# Patient Record
Sex: Male | Born: 1965 | Race: White | Hispanic: No | Marital: Married | Smoking: Never smoker
Health system: Southern US, Community
[De-identification: ages and names within clinical notes are randomized; demographics above are authoritative.]

## PROBLEM LIST (undated history)

## (undated) DIAGNOSIS — E119 Type 2 diabetes mellitus without complications: Secondary | ICD-10-CM

## (undated) DIAGNOSIS — I10 Essential (primary) hypertension: Secondary | ICD-10-CM

## (undated) DIAGNOSIS — E785 Hyperlipidemia, unspecified: Secondary | ICD-10-CM

## (undated) HISTORY — DX: Essential (primary) hypertension: I10

## (undated) HISTORY — DX: Hyperlipidemia, unspecified: E78.5

## (undated) HISTORY — PX: CATARACT EXTRACTION: SUR2

## (undated) HISTORY — DX: Type 2 diabetes mellitus without complications: E11.9

---
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
## 2005-01-26 ENCOUNTER — Ambulatory Visit (HOSPITAL_COMMUNITY): Admission: RE | Admit: 2005-01-26 | Discharge: 2005-01-26 | Payer: Self-pay | Admitting: Internal Medicine

## 2005-01-26 IMAGING — US US RENAL
1 series · 14 of 25 positions shown · non-contrast
Comparison: None.

CLINICAL DATA: Decreased renal function; protein in the urine.
 RENAL/URINARY TRACT ULTRASOUND:
TECHNIQUE: Complete ultrasound examination of the urinary tract was performed including evaluation of the kidneys, renal collecting systems, and urinary bladder.

[Series 1: unknown · 0.28mm/px · 14 of 29 slices shown]
[im 1/29]
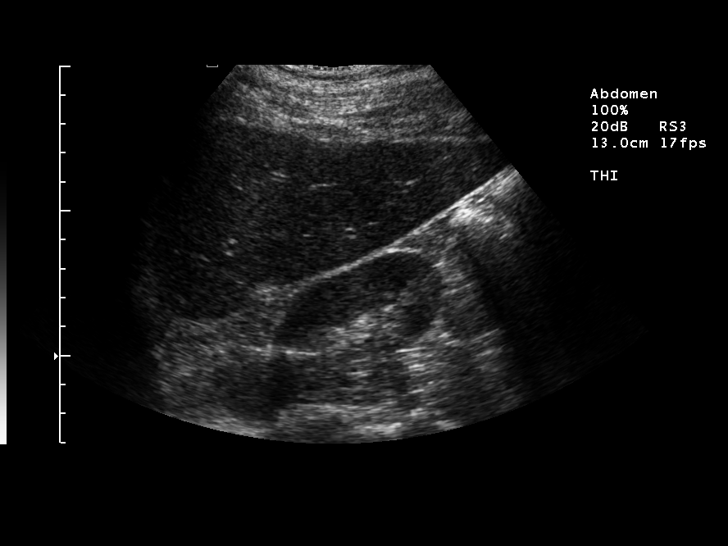
[im 3/29]
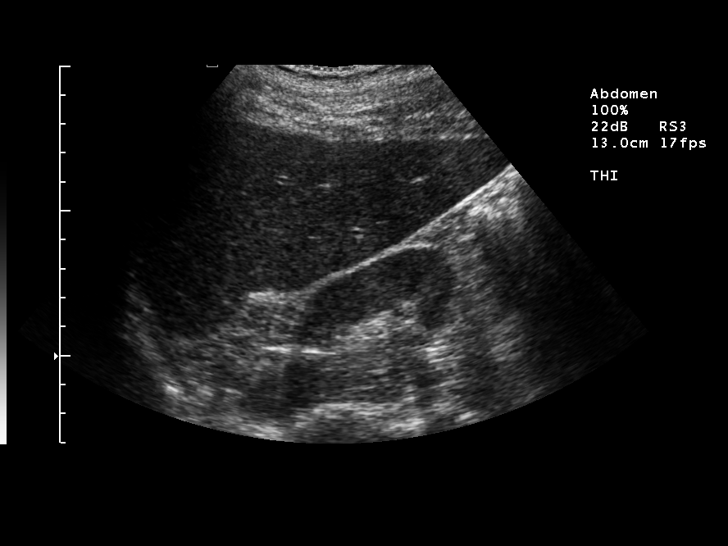
[im 5/29]
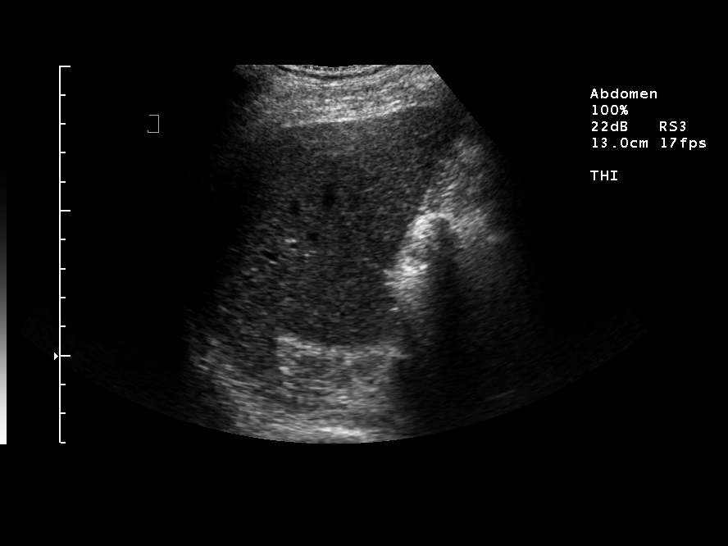
[im 8/29]
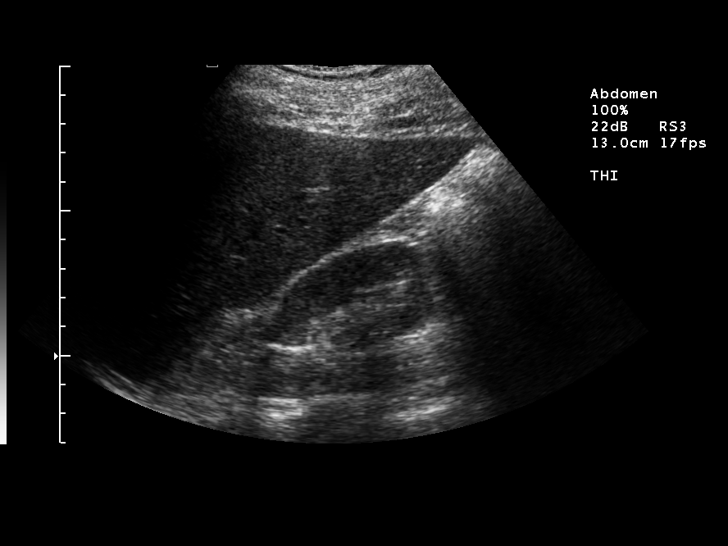
[im 10/29]
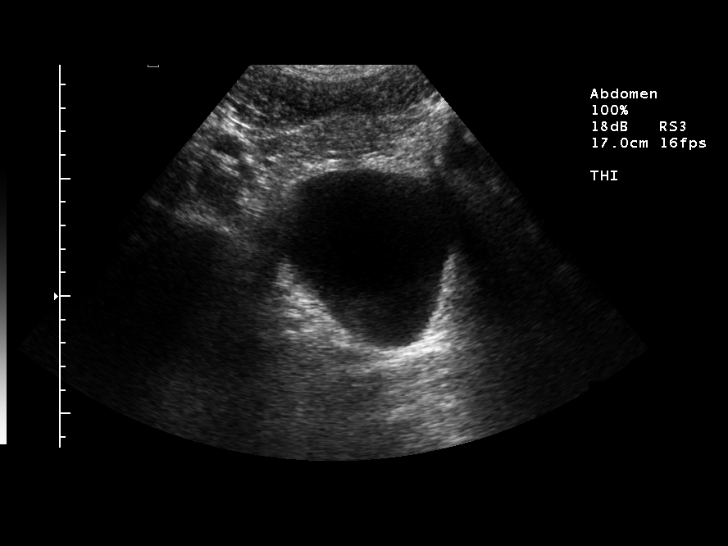
[im 11/29]
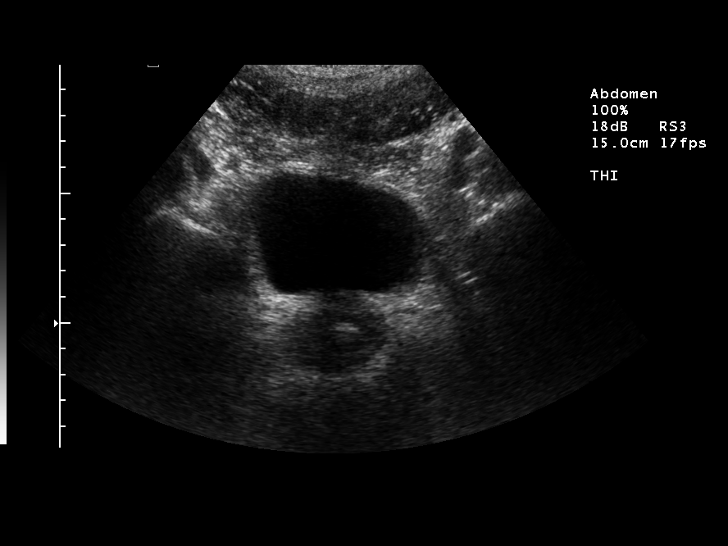
[im 13/29]
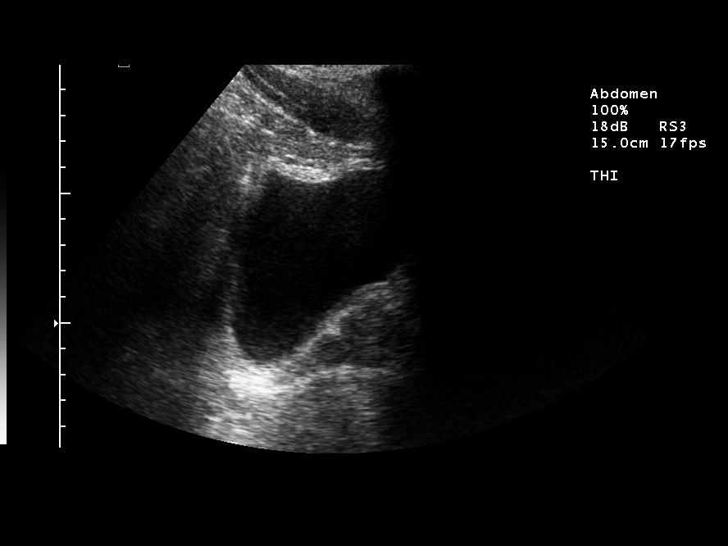
[im 16/29]
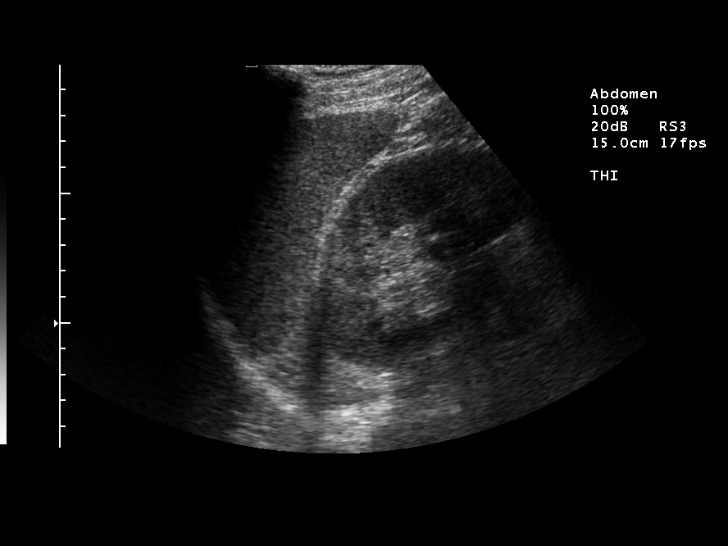
[im 18/29]
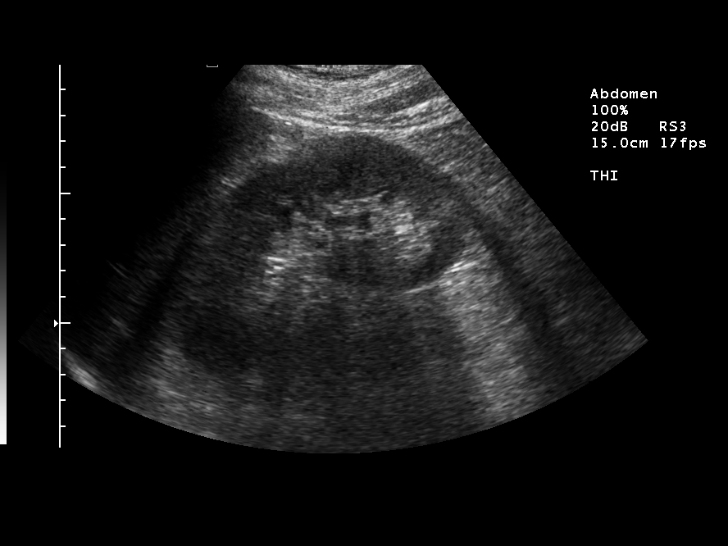
[im 19/29]
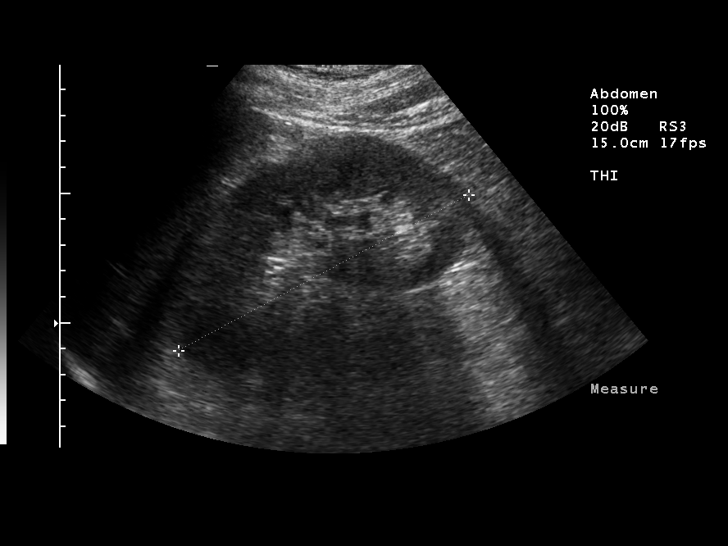
[im 22/29]
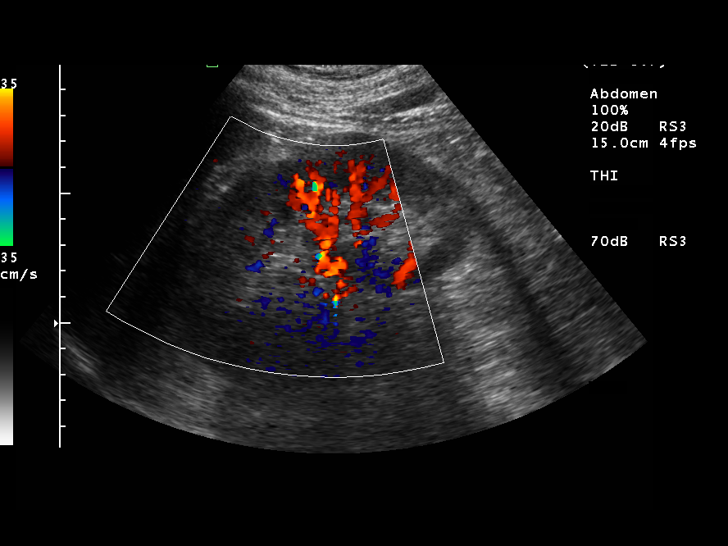
[im 24/29]
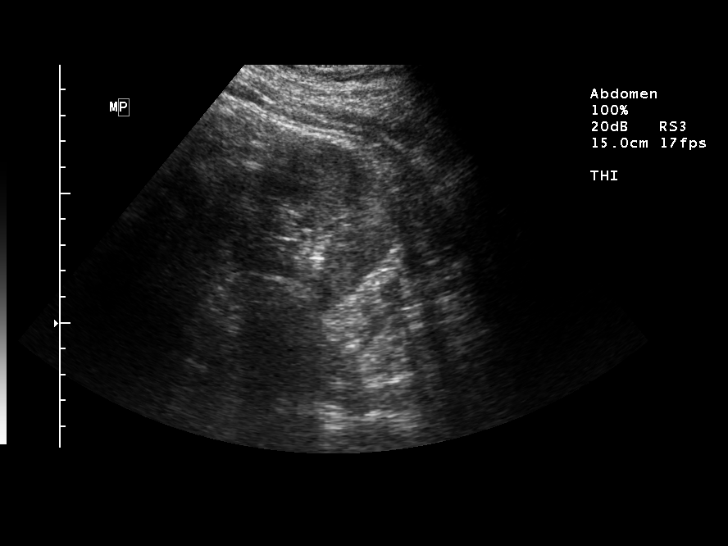
[im 26/29]
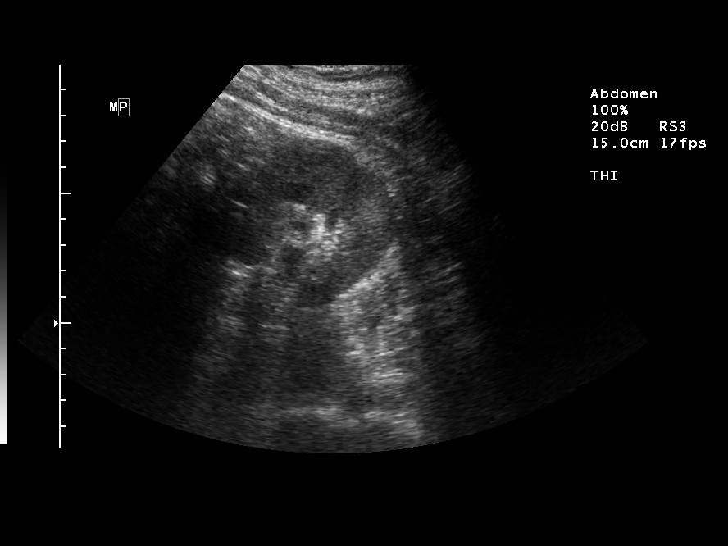
[im 29/29]
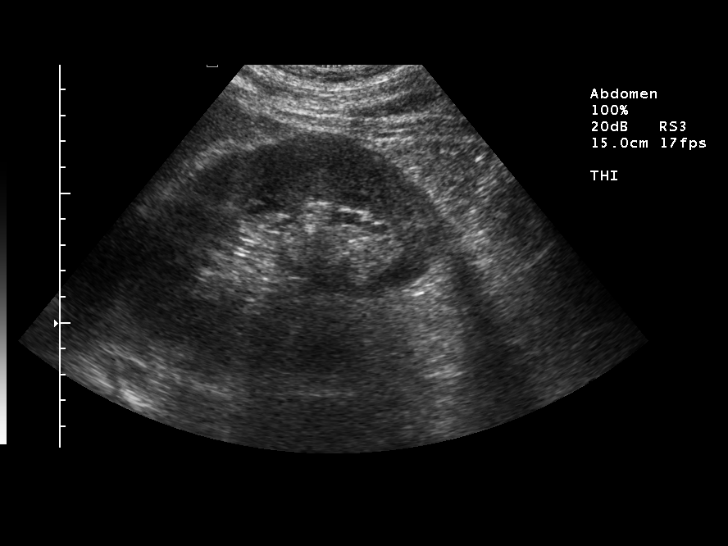

[14 of 25 positions shown; findings below may reference images not displayed]

FINDINGS: The right kidney measures 6.2 and the left kidney measures 12.7 cm.  The patient reportedly has a history of damaged upper pole of the right kidney from previous accident.  Remaining cortex is unremarkable, however.  
 Bladder is unremarkable with apparent prominence of the prostate gland.
IMPRESSION: 1.  Right kidney is small but the visible cortex of the right kidney is unremarkable.  
 2.  No abnormality on the left.
 3.  No hydronephrosis.

## 2015-05-19 DIAGNOSIS — R232 Flushing: Secondary | ICD-10-CM | POA: Diagnosis not present

## 2015-05-19 DIAGNOSIS — I10 Essential (primary) hypertension: Secondary | ICD-10-CM | POA: Diagnosis not present

## 2015-05-19 DIAGNOSIS — E109 Type 1 diabetes mellitus without complications: Secondary | ICD-10-CM | POA: Diagnosis not present

## 2015-05-19 DIAGNOSIS — R209 Unspecified disturbances of skin sensation: Secondary | ICD-10-CM | POA: Diagnosis not present

## 2015-06-24 DIAGNOSIS — E109 Type 1 diabetes mellitus without complications: Secondary | ICD-10-CM | POA: Diagnosis not present

## 2015-09-19 DIAGNOSIS — E109 Type 1 diabetes mellitus without complications: Secondary | ICD-10-CM | POA: Diagnosis not present

## 2015-09-23 DIAGNOSIS — I10 Essential (primary) hypertension: Secondary | ICD-10-CM | POA: Diagnosis not present

## 2015-09-23 DIAGNOSIS — Z125 Encounter for screening for malignant neoplasm of prostate: Secondary | ICD-10-CM | POA: Diagnosis not present

## 2015-09-23 DIAGNOSIS — E109 Type 1 diabetes mellitus without complications: Secondary | ICD-10-CM | POA: Diagnosis not present

## 2015-09-23 DIAGNOSIS — E784 Other hyperlipidemia: Secondary | ICD-10-CM | POA: Diagnosis not present

## 2015-09-29 DIAGNOSIS — I10 Essential (primary) hypertension: Secondary | ICD-10-CM | POA: Diagnosis not present

## 2015-09-29 DIAGNOSIS — R209 Unspecified disturbances of skin sensation: Secondary | ICD-10-CM | POA: Diagnosis not present

## 2015-09-29 DIAGNOSIS — Z0000 Encounter for general adult medical examination without abnormal findings: Secondary | ICD-10-CM | POA: Diagnosis not present

## 2015-09-29 DIAGNOSIS — E109 Type 1 diabetes mellitus without complications: Secondary | ICD-10-CM | POA: Diagnosis not present

## 2015-09-29 DIAGNOSIS — Z23 Encounter for immunization: Secondary | ICD-10-CM | POA: Diagnosis not present

## 2015-10-21 DIAGNOSIS — E109 Type 1 diabetes mellitus without complications: Secondary | ICD-10-CM | POA: Diagnosis not present

## 2015-11-09 DIAGNOSIS — J069 Acute upper respiratory infection, unspecified: Secondary | ICD-10-CM | POA: Diagnosis not present

## 2015-11-17 DIAGNOSIS — M7671 Peroneal tendinitis, right leg: Secondary | ICD-10-CM | POA: Diagnosis not present

## 2015-11-17 DIAGNOSIS — R200 Anesthesia of skin: Secondary | ICD-10-CM | POA: Diagnosis not present

## 2016-01-07 DIAGNOSIS — E119 Type 2 diabetes mellitus without complications: Secondary | ICD-10-CM | POA: Diagnosis not present

## 2016-01-19 DIAGNOSIS — E109 Type 1 diabetes mellitus without complications: Secondary | ICD-10-CM | POA: Diagnosis not present

## 2016-01-19 DIAGNOSIS — Z6827 Body mass index (BMI) 27.0-27.9, adult: Secondary | ICD-10-CM | POA: Diagnosis not present

## 2016-01-19 DIAGNOSIS — R209 Unspecified disturbances of skin sensation: Secondary | ICD-10-CM | POA: Diagnosis not present

## 2016-01-19 DIAGNOSIS — I10 Essential (primary) hypertension: Secondary | ICD-10-CM | POA: Diagnosis not present

## 2016-04-12 DIAGNOSIS — M25521 Pain in right elbow: Secondary | ICD-10-CM | POA: Diagnosis not present

## 2016-04-12 DIAGNOSIS — M25511 Pain in right shoulder: Secondary | ICD-10-CM | POA: Diagnosis not present

## 2016-04-14 DIAGNOSIS — R531 Weakness: Secondary | ICD-10-CM | POA: Diagnosis not present

## 2016-04-14 DIAGNOSIS — M25521 Pain in right elbow: Secondary | ICD-10-CM | POA: Diagnosis not present

## 2016-04-14 DIAGNOSIS — M7711 Lateral epicondylitis, right elbow: Secondary | ICD-10-CM | POA: Diagnosis not present

## 2016-06-15 DIAGNOSIS — E109 Type 1 diabetes mellitus without complications: Secondary | ICD-10-CM | POA: Diagnosis not present

## 2016-06-28 DIAGNOSIS — Z6827 Body mass index (BMI) 27.0-27.9, adult: Secondary | ICD-10-CM | POA: Diagnosis not present

## 2016-06-28 DIAGNOSIS — I10 Essential (primary) hypertension: Secondary | ICD-10-CM | POA: Diagnosis not present

## 2016-06-28 DIAGNOSIS — Z1389 Encounter for screening for other disorder: Secondary | ICD-10-CM | POA: Diagnosis not present

## 2016-06-28 DIAGNOSIS — E109 Type 1 diabetes mellitus without complications: Secondary | ICD-10-CM | POA: Diagnosis not present

## 2016-06-28 DIAGNOSIS — E785 Hyperlipidemia, unspecified: Secondary | ICD-10-CM | POA: Diagnosis not present

## 2016-08-24 DIAGNOSIS — K3580 Unspecified acute appendicitis: Secondary | ICD-10-CM | POA: Diagnosis not present

## 2016-10-18 DIAGNOSIS — I10 Essential (primary) hypertension: Secondary | ICD-10-CM | POA: Diagnosis not present

## 2016-10-18 DIAGNOSIS — E109 Type 1 diabetes mellitus without complications: Secondary | ICD-10-CM | POA: Diagnosis not present

## 2016-10-18 DIAGNOSIS — Z125 Encounter for screening for malignant neoplasm of prostate: Secondary | ICD-10-CM | POA: Diagnosis not present

## 2016-10-18 DIAGNOSIS — Z0000 Encounter for general adult medical examination without abnormal findings: Secondary | ICD-10-CM | POA: Diagnosis not present

## 2016-10-25 DIAGNOSIS — N528 Other male erectile dysfunction: Secondary | ICD-10-CM | POA: Diagnosis not present

## 2016-10-25 DIAGNOSIS — E109 Type 1 diabetes mellitus without complications: Secondary | ICD-10-CM | POA: Diagnosis not present

## 2016-10-25 DIAGNOSIS — Z23 Encounter for immunization: Secondary | ICD-10-CM | POA: Diagnosis not present

## 2016-10-25 DIAGNOSIS — Z1389 Encounter for screening for other disorder: Secondary | ICD-10-CM | POA: Diagnosis not present

## 2016-10-25 DIAGNOSIS — E784 Other hyperlipidemia: Secondary | ICD-10-CM | POA: Diagnosis not present

## 2016-10-25 DIAGNOSIS — Z0000 Encounter for general adult medical examination without abnormal findings: Secondary | ICD-10-CM | POA: Diagnosis not present

## 2016-10-25 DIAGNOSIS — I10 Essential (primary) hypertension: Secondary | ICD-10-CM | POA: Diagnosis not present

## 2016-10-27 DIAGNOSIS — E109 Type 1 diabetes mellitus without complications: Secondary | ICD-10-CM | POA: Diagnosis not present

## 2016-10-27 DIAGNOSIS — Z794 Long term (current) use of insulin: Secondary | ICD-10-CM | POA: Diagnosis not present

## 2016-11-12 DIAGNOSIS — Z794 Long term (current) use of insulin: Secondary | ICD-10-CM | POA: Diagnosis not present

## 2016-11-12 DIAGNOSIS — E109 Type 1 diabetes mellitus without complications: Secondary | ICD-10-CM | POA: Diagnosis not present

## 2016-12-01 ENCOUNTER — Ambulatory Visit (INDEPENDENT_AMBULATORY_CARE_PROVIDER_SITE_OTHER): Payer: Self-pay | Admitting: Emergency Medicine

## 2016-12-01 VITALS — BP 122/84 | HR 66 | Temp 98.4°F | Resp 17 | Wt 194.6 lb

## 2016-12-01 DIAGNOSIS — J069 Acute upper respiratory infection, unspecified: Secondary | ICD-10-CM

## 2016-12-01 MED ORDER — AZITHROMYCIN 250 MG PO TABS: ORAL_TABLET | ORAL | 0 refills | Status: DC

## 2016-12-01 MED ORDER — GUAIFENESIN-CODEINE 100-10 MG/5ML PO SOLN: 5.0000 mL | Freq: Four times a day (QID) | ORAL | 0 refills | Status: DC | PRN

## 2016-12-01 NOTE — Progress Notes (Signed)
Subjective:  Steven Hoffman is a 51 y.o. male who presents for evaluation of URI like symptoms.  Symptoms include achiness, congestion, coryza, cough described as nonproductive, chills and non productive cough.  Onset of symptoms was 1 week ago, and has been unchanged since that time.  Treatment to date:  cough suppressants, decongestants and rest.  High risk factors for influenza complications:  none.  The following portions of the patient's history were reviewed and updated as appropriate:  allergies, current medications and past medical history.  Pertinent items are noted in HPI. Objective:   Vitals:   12/01/16 1436  BP: 122/84  Pulse: 66  Resp: 17  Temp: 98.4 F (36.9 C)  SpO2: 96%   Physical Exam  Constitutional: He is oriented to person, place, and time. He appears well-developed and well-nourished. No distress.  HENT:  Head: Normocephalic.  Right Ear: Tympanic membrane and external ear normal.  Left Ear: Tympanic membrane and external ear normal.  Mouth/Throat: Oropharynx is clear and moist.  Eyes: Conjunctivae are normal.  Neck: Normal range of motion.  Cardiovascular: Normal rate and regular rhythm.   Pulmonary/Chest: Effort normal and breath sounds normal. He has no wheezes.  Abdominal: Soft. Bowel sounds are normal.  Lymphadenopathy:    He has no cervical adenopathy.  Neurological: He is alert and oriented to person, place, and time.  Skin: Skin is warm and dry. Capillary refill takes less than 2 seconds. He is not diaphoretic.  Nursing note and vitals reviewed.     Assessment:  viral upper respiratory illness    Plan:  Discussed diagnosis and treatment of URI. Discussed the importance of avoiding unnecessary antibiotic therapy. Educational material distributed and questions answered. Suggested symptomatic OTC remedies. Suggested brief course of Afrin or similar. Supportive care with appropriate antipyretics and fluids. Follow up as needed. Provided a delayed  fill Rx for Azithromycin but encouraged not to fill for 3 days.   Provided counseling about sedation and need to avoid driving while on Cheratussin due to narcotic effects.

## 2016-12-01 NOTE — Patient Instructions (Signed)
Viral Illness, Adult Viruses are tiny germs that can get into a person's body and cause illness. There are many different types of viruses, and they cause many types of illness. Viral illnesses can range from mild to severe. They can affect various parts of the body. Common illnesses that are caused by a virus include colds and the flu. Viral illnesses also include serious conditions such as HIV/AIDS (human immunodeficiency virus/acquired immunodeficiency syndrome). A few viruses have been linked to certain cancers. What are the causes? Many types of viruses can cause illness. Viruses invade cells in your body, multiply, and cause the infected cells to malfunction or die. When the cell dies, it releases more of the virus. When this happens, you develop symptoms of the illness, and the virus continues to spread to other cells. If the virus takes over the function of the cell, it can cause the cell to divide and grow out of control, as is the case when a virus causes cancer. Different viruses get into the body in different ways. You can get a virus by:  Swallowing food or water that is contaminated with the virus.  Breathing in droplets that have been coughed or sneezed into the air by an infected person.  Touching a surface that has been contaminated with the virus and then touching your eyes, nose, or mouth.  Being bitten by an insect or animal that carries the virus.  Having sexual contact with a person who is infected with the virus.  Being exposed to blood or fluids that contain the virus, either through an open cut or during a transfusion.  If a virus enters your body, your body's defense system (immune system) will try to fight the virus. You may be at higher risk for a viral illness if your immune system is weak. What are the signs or symptoms? Symptoms vary depending on the type of virus and the location of the cells that it invades. Common symptoms of the main types of viral illnesses  include: Cold and flu viruses  Fever.  Headache.  Sore throat.  Muscle aches.  Nasal congestion.  Cough. Digestive system (gastrointestinal) viruses  Fever.  Abdominal pain.  Nausea.  Diarrhea. Liver viruses (hepatitis)  Loss of appetite.  Tiredness.  Yellowing of the skin (jaundice). Brain and spinal cord viruses  Fever.  Headache.  Stiff neck.  Nausea and vomiting.  Confusion or sleepiness. Skin viruses  Warts.  Itching.  Rash. Sexually transmitted viruses  Discharge.  Swelling.  Redness.  Rash. How is this treated? Viruses can be difficult to treat because they live within cells. Antibiotic medicines do not treat viruses because these drugs do not get inside cells. Treatment for a viral illness may include:  Resting and drinking plenty of fluids.  Medicines to relieve symptoms. These can include over-the-counter medicine for pain and fever, medicines for cough or congestion, and medicines to relieve diarrhea.  Antiviral medicines. These drugs are available only for certain types of viruses. They may help reduce flu symptoms if taken early. There are also many antiviral medicines for hepatitis and HIV/AIDS.  Some viral illnesses can be prevented with vaccinations. A common example is the flu shot. Follow these instructions at home: Medicines   Take over-the-counter and prescription medicines only as told by your health care provider.  If you were prescribed an antiviral medicine, take it as told by your health care provider. Do not stop taking the medicine even if you start to feel better.  Be aware   of when antibiotics are needed and when they are not needed. Antibiotics do not treat viruses. If your health care provider thinks that you may have a bacterial infection as well as a viral infection, you may get an antibiotic. ? Do not ask for an antibiotic prescription if you have been diagnosed with a viral illness. That will not make your  illness go away faster. ? Frequently taking antibiotics when they are not needed can lead to antibiotic resistance. When this develops, the medicine no longer works against the bacteria that it normally fights. General instructions  Drink enough fluids to keep your urine clear or pale yellow.  Rest as much as possible.  Return to your normal activities as told by your health care provider. Ask your health care provider what activities are safe for you.  Keep all follow-up visits as told by your health care provider. This is important. How is this prevented? Take these actions to reduce your risk of viral infection:  Eat a healthy diet and get enough rest.  Wash your hands often with soap and water. This is especially important when you are in public places. If soap and water are not available, use hand sanitizer.  Avoid close contact with friends and family who have a viral illness.  If you travel to areas where viral gastrointestinal infection is common, avoid drinking water or eating raw food.  Keep your immunizations up to date. Get a flu shot every year as told by your health care provider.  Do not share toothbrushes, nail clippers, razors, or needles with other people.  Always practice safe sex.  Contact a health care provider if:  You have symptoms of a viral illness that do not go away.  Your symptoms come back after going away.  Your symptoms get worse. Get help right away if:  You have trouble breathing.  You have a severe headache or a stiff neck.  You have severe vomiting or abdominal pain. This information is not intended to replace advice given to you by your health care provider. Make sure you discuss any questions you have with your health care provider. Document Released: 06/06/2015 Document Revised: 07/09/2015 Document Reviewed: 06/06/2015 Elsevier Interactive Patient Education  2018 [employer]  

## 2016-12-03 ENCOUNTER — Telehealth: Payer: Self-pay

## 2016-12-23 DIAGNOSIS — E109 Type 1 diabetes mellitus without complications: Secondary | ICD-10-CM | POA: Diagnosis not present

## 2017-02-05 NOTE — Telephone Encounter (Signed)
I called to his home number and work number and I wasnt able to contact Mr. Job Holtsclaw

## 2017-02-17 DIAGNOSIS — Z794 Long term (current) use of insulin: Secondary | ICD-10-CM | POA: Diagnosis not present

## 2017-02-17 DIAGNOSIS — E109 Type 1 diabetes mellitus without complications: Secondary | ICD-10-CM | POA: Diagnosis not present

## 2017-04-18 DIAGNOSIS — E109 Type 1 diabetes mellitus without complications: Secondary | ICD-10-CM | POA: Diagnosis not present

## 2017-04-18 DIAGNOSIS — Z6827 Body mass index (BMI) 27.0-27.9, adult: Secondary | ICD-10-CM | POA: Diagnosis not present

## 2017-04-18 DIAGNOSIS — E785 Hyperlipidemia, unspecified: Secondary | ICD-10-CM | POA: Diagnosis not present

## 2017-04-18 DIAGNOSIS — I10 Essential (primary) hypertension: Secondary | ICD-10-CM | POA: Diagnosis not present

## 2017-05-18 DIAGNOSIS — E109 Type 1 diabetes mellitus without complications: Secondary | ICD-10-CM | POA: Diagnosis not present

## 2017-05-18 DIAGNOSIS — Z794 Long term (current) use of insulin: Secondary | ICD-10-CM | POA: Diagnosis not present

## 2017-08-17 DIAGNOSIS — Z794 Long term (current) use of insulin: Secondary | ICD-10-CM | POA: Diagnosis not present

## 2017-08-17 DIAGNOSIS — E109 Type 1 diabetes mellitus without complications: Secondary | ICD-10-CM | POA: Diagnosis not present

## 2017-09-15 DIAGNOSIS — I10 Essential (primary) hypertension: Secondary | ICD-10-CM | POA: Diagnosis not present

## 2017-09-15 DIAGNOSIS — M62838 Other muscle spasm: Secondary | ICD-10-CM | POA: Diagnosis not present

## 2017-09-15 DIAGNOSIS — E860 Dehydration: Secondary | ICD-10-CM | POA: Diagnosis not present

## 2017-09-19 DIAGNOSIS — M62838 Other muscle spasm: Secondary | ICD-10-CM | POA: Diagnosis not present

## 2017-09-20 DIAGNOSIS — M6282 Rhabdomyolysis: Secondary | ICD-10-CM | POA: Diagnosis not present

## 2017-09-20 DIAGNOSIS — M62838 Other muscle spasm: Secondary | ICD-10-CM | POA: Diagnosis not present

## 2017-09-20 DIAGNOSIS — I10 Essential (primary) hypertension: Secondary | ICD-10-CM | POA: Diagnosis not present

## 2017-09-20 DIAGNOSIS — E109 Type 1 diabetes mellitus without complications: Secondary | ICD-10-CM | POA: Diagnosis not present

## 2017-11-18 DIAGNOSIS — Z794 Long term (current) use of insulin: Secondary | ICD-10-CM | POA: Diagnosis not present

## 2017-11-18 DIAGNOSIS — E109 Type 1 diabetes mellitus without complications: Secondary | ICD-10-CM | POA: Diagnosis not present

## 2017-11-21 DIAGNOSIS — Z794 Long term (current) use of insulin: Secondary | ICD-10-CM | POA: Diagnosis not present

## 2017-11-21 DIAGNOSIS — E860 Dehydration: Secondary | ICD-10-CM | POA: Diagnosis not present

## 2017-11-21 DIAGNOSIS — E109 Type 1 diabetes mellitus without complications: Secondary | ICD-10-CM | POA: Diagnosis not present

## 2017-11-24 DIAGNOSIS — M6282 Rhabdomyolysis: Secondary | ICD-10-CM | POA: Diagnosis not present

## 2017-11-24 DIAGNOSIS — Z1389 Encounter for screening for other disorder: Secondary | ICD-10-CM | POA: Diagnosis not present

## 2017-11-24 DIAGNOSIS — I10 Essential (primary) hypertension: Secondary | ICD-10-CM | POA: Diagnosis not present

## 2017-11-24 DIAGNOSIS — E860 Dehydration: Secondary | ICD-10-CM | POA: Diagnosis not present

## 2017-11-24 DIAGNOSIS — M722 Plantar fascial fibromatosis: Secondary | ICD-10-CM | POA: Diagnosis not present

## 2018-01-02 DIAGNOSIS — M25571 Pain in right ankle and joints of right foot: Secondary | ICD-10-CM | POA: Diagnosis not present

## 2018-01-02 DIAGNOSIS — M25561 Pain in right knee: Secondary | ICD-10-CM | POA: Diagnosis not present

## 2018-01-09 ENCOUNTER — Encounter: Payer: Self-pay | Admitting: Sports Medicine

## 2018-02-15 DIAGNOSIS — E109 Type 1 diabetes mellitus without complications: Secondary | ICD-10-CM | POA: Diagnosis not present

## 2018-02-20 DIAGNOSIS — Z794 Long term (current) use of insulin: Secondary | ICD-10-CM | POA: Diagnosis not present

## 2018-02-20 DIAGNOSIS — E109 Type 1 diabetes mellitus without complications: Secondary | ICD-10-CM | POA: Diagnosis not present

## 2018-02-22 DIAGNOSIS — E109 Type 1 diabetes mellitus without complications: Secondary | ICD-10-CM | POA: Diagnosis not present

## 2018-02-22 DIAGNOSIS — R82998 Other abnormal findings in urine: Secondary | ICD-10-CM | POA: Diagnosis not present

## 2018-02-22 DIAGNOSIS — I10 Essential (primary) hypertension: Secondary | ICD-10-CM | POA: Diagnosis not present

## 2018-02-28 DIAGNOSIS — E109 Type 1 diabetes mellitus without complications: Secondary | ICD-10-CM | POA: Diagnosis not present

## 2018-02-28 DIAGNOSIS — E7849 Other hyperlipidemia: Secondary | ICD-10-CM | POA: Diagnosis not present

## 2018-02-28 DIAGNOSIS — Z0000 Encounter for general adult medical examination without abnormal findings: Secondary | ICD-10-CM | POA: Diagnosis not present

## 2018-02-28 DIAGNOSIS — Z125 Encounter for screening for malignant neoplasm of prostate: Secondary | ICD-10-CM | POA: Diagnosis not present

## 2018-02-28 DIAGNOSIS — N529 Male erectile dysfunction, unspecified: Secondary | ICD-10-CM | POA: Diagnosis not present

## 2018-02-28 DIAGNOSIS — Z1389 Encounter for screening for other disorder: Secondary | ICD-10-CM | POA: Diagnosis not present

## 2018-02-28 DIAGNOSIS — I10 Essential (primary) hypertension: Secondary | ICD-10-CM | POA: Diagnosis not present

## 2018-06-29 DIAGNOSIS — R221 Localized swelling, mass and lump, neck: Secondary | ICD-10-CM | POA: Diagnosis not present

## 2018-06-29 DIAGNOSIS — Z03818 Encounter for observation for suspected exposure to other biological agents ruled out: Secondary | ICD-10-CM | POA: Diagnosis not present

## 2018-07-07 ENCOUNTER — Other Ambulatory Visit: Payer: Self-pay | Admitting: Internal Medicine

## 2018-07-07 DIAGNOSIS — R221 Localized swelling, mass and lump, neck: Secondary | ICD-10-CM

## 2018-07-11 ENCOUNTER — Ambulatory Visit
Admission: RE | Admit: 2018-07-11 | Discharge: 2018-07-11 | Disposition: A | Discharge status: Home / Self Care | Payer: BLUE CROSS/BLUE SHIELD | Source: Ambulatory Visit | Attending: Internal Medicine | Admitting: Internal Medicine

## 2018-07-11 DIAGNOSIS — R221 Localized swelling, mass and lump, neck: Secondary | ICD-10-CM

## 2018-07-11 MED ORDER — IOPAMIDOL (ISOVUE-300) INJECTION 61%
75.0000 mL | Freq: Once | INTRAVENOUS | Status: AC | PRN
  Administered 2018-07-11: 75 mL via INTRAVENOUS

## 2018-10-12 DIAGNOSIS — R5383 Other fatigue: Secondary | ICD-10-CM | POA: Diagnosis not present

## 2018-10-12 DIAGNOSIS — R891 Abnormal level of hormones in specimens from other organs, systems and tissues: Secondary | ICD-10-CM | POA: Diagnosis not present

## 2018-11-01 ENCOUNTER — Other Ambulatory Visit: Payer: Self-pay

## 2018-11-01 DIAGNOSIS — Z20822 Contact with and (suspected) exposure to covid-19: Secondary | ICD-10-CM

## 2018-11-03 LAB — NOVEL CORONAVIRUS, NAA: SARS-CoV-2, NAA: NOT DETECTED

## 2018-11-27 DIAGNOSIS — I10 Essential (primary) hypertension: Secondary | ICD-10-CM | POA: Diagnosis not present

## 2018-11-27 DIAGNOSIS — E785 Hyperlipidemia, unspecified: Secondary | ICD-10-CM | POA: Diagnosis not present

## 2018-11-27 DIAGNOSIS — M545 Low back pain: Secondary | ICD-10-CM | POA: Diagnosis not present

## 2018-11-27 DIAGNOSIS — E109 Type 1 diabetes mellitus without complications: Secondary | ICD-10-CM | POA: Diagnosis not present

## 2018-12-18 DIAGNOSIS — I10 Essential (primary) hypertension: Secondary | ICD-10-CM | POA: Diagnosis not present

## 2018-12-21 DIAGNOSIS — Z86010 Personal history of colonic polyps: Secondary | ICD-10-CM | POA: Diagnosis not present

## 2018-12-21 DIAGNOSIS — Z1211 Encounter for screening for malignant neoplasm of colon: Secondary | ICD-10-CM | POA: Diagnosis not present

## 2018-12-21 DIAGNOSIS — K5730 Diverticulosis of large intestine without perforation or abscess without bleeding: Secondary | ICD-10-CM | POA: Diagnosis not present

## 2018-12-29 DIAGNOSIS — D123 Benign neoplasm of transverse colon: Secondary | ICD-10-CM | POA: Diagnosis not present

## 2018-12-29 DIAGNOSIS — K635 Polyp of colon: Secondary | ICD-10-CM | POA: Diagnosis not present

## 2018-12-29 DIAGNOSIS — K5730 Diverticulosis of large intestine without perforation or abscess without bleeding: Secondary | ICD-10-CM | POA: Diagnosis not present

## 2018-12-29 DIAGNOSIS — D122 Benign neoplasm of ascending colon: Secondary | ICD-10-CM | POA: Diagnosis not present

## 2018-12-29 DIAGNOSIS — D120 Benign neoplasm of cecum: Secondary | ICD-10-CM | POA: Diagnosis not present

## 2018-12-29 DIAGNOSIS — K6389 Other specified diseases of intestine: Secondary | ICD-10-CM | POA: Diagnosis not present

## 2018-12-29 DIAGNOSIS — Z1211 Encounter for screening for malignant neoplasm of colon: Secondary | ICD-10-CM | POA: Diagnosis not present

## 2019-02-20 DIAGNOSIS — Z03818 Encounter for observation for suspected exposure to other biological agents ruled out: Secondary | ICD-10-CM | POA: Diagnosis not present

## 2019-02-26 DIAGNOSIS — Z125 Encounter for screening for malignant neoplasm of prostate: Secondary | ICD-10-CM | POA: Diagnosis not present

## 2019-02-26 DIAGNOSIS — E7849 Other hyperlipidemia: Secondary | ICD-10-CM | POA: Diagnosis not present

## 2019-02-26 DIAGNOSIS — Z0000 Encounter for general adult medical examination without abnormal findings: Secondary | ICD-10-CM | POA: Diagnosis not present

## 2019-02-26 DIAGNOSIS — E109 Type 1 diabetes mellitus without complications: Secondary | ICD-10-CM | POA: Diagnosis not present

## 2019-03-01 DIAGNOSIS — I10 Essential (primary) hypertension: Secondary | ICD-10-CM | POA: Diagnosis not present

## 2019-03-01 DIAGNOSIS — R82998 Other abnormal findings in urine: Secondary | ICD-10-CM | POA: Diagnosis not present

## 2019-03-05 DIAGNOSIS — E785 Hyperlipidemia, unspecified: Secondary | ICD-10-CM | POA: Diagnosis not present

## 2019-03-05 DIAGNOSIS — Z1331 Encounter for screening for depression: Secondary | ICD-10-CM | POA: Diagnosis not present

## 2019-03-05 DIAGNOSIS — E109 Type 1 diabetes mellitus without complications: Secondary | ICD-10-CM | POA: Diagnosis not present

## 2019-03-05 DIAGNOSIS — N529 Male erectile dysfunction, unspecified: Secondary | ICD-10-CM | POA: Diagnosis not present

## 2019-03-05 DIAGNOSIS — I10 Essential (primary) hypertension: Secondary | ICD-10-CM | POA: Diagnosis not present

## 2019-03-05 DIAGNOSIS — Z0000 Encounter for general adult medical examination without abnormal findings: Secondary | ICD-10-CM | POA: Diagnosis not present

## 2019-03-12 ENCOUNTER — Ambulatory Visit: Payer: BC Managed Care – PPO | Attending: Internal Medicine

## 2019-03-12 DIAGNOSIS — Z20822 Contact with and (suspected) exposure to covid-19: Secondary | ICD-10-CM | POA: Diagnosis not present

## 2019-03-13 LAB — NOVEL CORONAVIRUS, NAA: SARS-CoV-2, NAA: NOT DETECTED

## 2019-03-22 ENCOUNTER — Ambulatory Visit: Payer: BC Managed Care – PPO | Attending: Internal Medicine

## 2019-03-22 DIAGNOSIS — Z20822 Contact with and (suspected) exposure to covid-19: Secondary | ICD-10-CM | POA: Diagnosis not present

## 2019-03-23 LAB — NOVEL CORONAVIRUS, NAA: SARS-CoV-2, NAA: NOT DETECTED

## 2019-06-26 DIAGNOSIS — E109 Type 1 diabetes mellitus without complications: Secondary | ICD-10-CM | POA: Diagnosis not present

## 2019-07-12 DIAGNOSIS — Z1389 Encounter for screening for other disorder: Secondary | ICD-10-CM | POA: Diagnosis not present

## 2019-07-12 DIAGNOSIS — E109 Type 1 diabetes mellitus without complications: Secondary | ICD-10-CM | POA: Diagnosis not present

## 2019-07-12 DIAGNOSIS — E7849 Other hyperlipidemia: Secondary | ICD-10-CM | POA: Diagnosis not present

## 2019-07-12 DIAGNOSIS — I10 Essential (primary) hypertension: Secondary | ICD-10-CM | POA: Diagnosis not present

## 2019-07-12 DIAGNOSIS — D751 Secondary polycythemia: Secondary | ICD-10-CM | POA: Diagnosis not present

## 2019-07-27 DIAGNOSIS — H401131 Primary open-angle glaucoma, bilateral, mild stage: Secondary | ICD-10-CM | POA: Diagnosis not present

## 2019-08-29 DIAGNOSIS — I10 Essential (primary) hypertension: Secondary | ICD-10-CM | POA: Diagnosis not present

## 2019-08-29 DIAGNOSIS — Z794 Long term (current) use of insulin: Secondary | ICD-10-CM | POA: Diagnosis not present

## 2019-08-29 DIAGNOSIS — E109 Type 1 diabetes mellitus without complications: Secondary | ICD-10-CM | POA: Diagnosis not present

## 2019-10-12 DIAGNOSIS — H40013 Open angle with borderline findings, low risk, bilateral: Secondary | ICD-10-CM | POA: Diagnosis not present

## 2019-12-03 DIAGNOSIS — E785 Hyperlipidemia, unspecified: Secondary | ICD-10-CM | POA: Diagnosis not present

## 2019-12-03 DIAGNOSIS — D751 Secondary polycythemia: Secondary | ICD-10-CM | POA: Diagnosis not present

## 2019-12-03 DIAGNOSIS — E109 Type 1 diabetes mellitus without complications: Secondary | ICD-10-CM | POA: Diagnosis not present

## 2019-12-03 DIAGNOSIS — I10 Essential (primary) hypertension: Secondary | ICD-10-CM | POA: Diagnosis not present

## 2019-12-18 ENCOUNTER — Telehealth: Payer: Self-pay | Admitting: Oncology

## 2019-12-18 NOTE — Telephone Encounter (Signed)
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 Mr. Steven Hoffman has been cld and scheduled to see Dr. Alen Blew on on 12/2 at 11am. Pt aware to arrive 15 minutes early.

## 2020-01-10 ENCOUNTER — Inpatient Hospital Stay: Payer: BC Managed Care – PPO | Attending: Oncology | Admitting: Oncology

## 2020-01-10 ENCOUNTER — Inpatient Hospital Stay: Payer: BC Managed Care – PPO

## 2020-01-10 ENCOUNTER — Other Ambulatory Visit: Payer: Self-pay

## 2020-01-10 VITALS — BP 142/85 | HR 70 | Temp 96.8°F | Resp 17 | Wt 191.5 lb

## 2020-01-10 DIAGNOSIS — D45 Polycythemia vera: Secondary | ICD-10-CM

## 2020-01-10 DIAGNOSIS — E785 Hyperlipidemia, unspecified: Secondary | ICD-10-CM | POA: Insufficient documentation

## 2020-01-10 DIAGNOSIS — D751 Secondary polycythemia: Secondary | ICD-10-CM | POA: Diagnosis not present

## 2020-01-10 DIAGNOSIS — E119 Type 2 diabetes mellitus without complications: Secondary | ICD-10-CM | POA: Diagnosis not present

## 2020-01-10 DIAGNOSIS — I10 Essential (primary) hypertension: Secondary | ICD-10-CM | POA: Insufficient documentation

## 2020-01-10 LAB — CMP (CANCER CENTER ONLY)
ALT: 35 U/L (ref 0–44)
AST: 26 U/L (ref 15–41)
Albumin: 4.1 g/dL (ref 3.5–5.0)
Alkaline Phosphatase: 89 U/L (ref 38–126)
Anion gap: 7 (ref 5–15)
BUN: 17 mg/dL (ref 6–20)
CO2: 26 mmol/L (ref 22–32)
Calcium: 9.2 mg/dL (ref 8.9–10.3)
Chloride: 104 mmol/L (ref 98–111)
Creatinine: 1.11 mg/dL (ref 0.61–1.24)
GFR, Estimated: >60 mL/min (ref >60)
Glucose, Bld: 123 mg/dL — ABNORMAL HIGH (ref 70–99)
Potassium: 4.2 mmol/L (ref 3.5–5.1)
Sodium: 137 mmol/L (ref 135–145)
Total Bilirubin: 1.1 mg/dL (ref 0.3–1.2)
Total Protein: 7.1 g/dL (ref 6.5–8.1)

## 2020-01-10 LAB — CBC WITH DIFFERENTIAL (CANCER CENTER ONLY)
Abs Immature Granulocytes: 0.01 10*3/uL (ref 0.00–0.07)
Basophils Absolute: 0 10*3/uL (ref 0.0–0.1)
Basophils Relative: 1 %
Eosinophils Absolute: 0 10*3/uL (ref 0.0–0.5)
Eosinophils Relative: 1 %
HCT: 47.9 % (ref 39.0–52.0)
Hemoglobin: 16.9 g/dL (ref 13.0–17.0)
Immature Granulocytes: 0 %
Lymphocytes Relative: 27 %
Lymphs Abs: 1.2 10*3/uL (ref 0.7–4.0)
MCH: 32.1 pg (ref 26.0–34.0)
MCHC: 35.3 g/dL (ref 30.0–36.0)
MCV: 91.1 fL (ref 80.0–100.0)
Monocytes Absolute: 0.6 10*3/uL (ref 0.1–1.0)
Monocytes Relative: 14 %
Neutro Abs: 2.6 10*3/uL (ref 1.7–7.7)
Neutrophils Relative %: 57 %
Platelet Count: 170 10*3/uL (ref 150–400)
RBC: 5.26 MIL/uL (ref 4.22–5.81)
RDW: 11.8 % (ref 11.5–15.5)
WBC Count: 4.6 10*3/uL (ref 4.0–10.5)
nRBC: 0 % (ref 0.0–0.2)

## 2020-01-10 NOTE — Progress Notes (Signed)
Reason for the request:    Polycythemia  HPI: I was asked by Dr. Dagmar Hait to evaluate Mr. Okray for the finding of a polycythemia.  He is a 54 year old man with history of diabetes and hypertension but overall in good health.  He started testosterone replacement 3 years ago and has discontinued it for the last year.  He was noted to have elevated hemoglobin in 2019 at that time his hemoglobin was 16.7 with a hematocrit of 50.  In January 2020 his hemoglobin was up to 17.1 with a hematocrit of 51.7.  In January 2021 his hemoglobin was 18.3 with hematocrit of 54.4.  In June 2021 his hemoglobin was 17.7 with a hematocrit of 52.3.  Laboratory data from October 25 showed a hemoglobin of 18.1, white cell count of 4.3 and platelet of 183 and normal differential.  Clinically, he reports no complaints at this time.  He denies any recent thrombosis or bleeding episodes.  He does report snoring and has to sleep separately from his partner because of it.    He does not report any headaches, blurry vision, syncope or seizures. Does not report any fevers, chills or sweats.  Does not report any cough, wheezing or hemoptysis.  Does not report any chest pain, palpitation, orthopnea or leg edema.  Does not report any nausea, vomiting or abdominal pain.  Does not report any constipation or diarrhea.  Does not report any skeletal complaints.    Does not report frequency, urgency or hematuria.  Does not report any skin rashes or lesions. Does not report any heat or cold intolerance.  Does not report any lymphadenopathy or petechiae.  Does not report any anxiety or depression.  Remaining review of systems is negative.    Past medical history significant for hypertension, hyperlipidemia and diabetes.     Current Outpatient Medications:  .  azithromycin (ZITHROMAX) 250 MG tablet, Recommend waiting 3 days before filling (12/04/16) Take 2 tablets day one, then 1 tablet daily till finished, Disp: 6 tablet, Rfl: 0 .   guaiFENesin-codeine 100-10 MG/5ML syrup, Take 5 mLs by mouth every 6 (six) hours as needed for cough., Disp: 120 mL, Rfl: 0 .  insulin aspart (NOVOLOG) 100 UNIT/ML injection, Inject 15 Units into the skin 3 (three) times daily before meals., Disp: , Rfl: :    Social History: Denies any alcohol or tobacco use.   Socioeconomic History  . Marital status: Married    Spouse name: Not on file  . Number of children: Not on file  . Years of education: Not on file  . Highest education level: Not on file  Occupational History  . Not on file  Tobacco Use  . Smoking status: Not on file  Substance and Sexual Activity  . Alcohol use: Not on file  . Drug use: Not on file  . Sexual activity: Not on file  Other Topics Concern  . Not on file  Social History Narrative  . Not on file   Social Determinants of Health   Financial Resource Strain:   . Difficulty of Paying Living Expenses: Not on file  Food Insecurity:   . Worried About Charity fundraiser in the Last Year: Not on file  . Ran Out of Food in the Last Year: Not on file  Transportation Needs:   . Lack of Transportation (Medical): Not on file  . Lack of Transportation (Non-Medical): Not on file  Physical Activity:   . Days of Exercise per Week: Not on file  .  Minutes of Exercise per Session: Not on file  Stress:   . Feeling of Stress : Not on file  Social Connections:   . Frequency of Communication with Friends and Family: Not on file  . Frequency of Social Gatherings with Friends and Family: Not on file  . Attends Religious Services: Not on file  . Active Member of Clubs or Organizations: Not on file  . Attends Archivist Meetings: Not on file  . Marital Status: Not on file  Intimate Partner Violence:   . Fear of Current or Ex-Partner: Not on file  . Emotionally Abused: Not on file  . Physically Abused: Not on file  . Sexually Abused: Not on file  :  Pertinent items are noted in HPI.  Exam:  General  appearance: alert and cooperative appeared without distress. Head: atraumatic without any abnormalities. Eyes: conjunctivae/corneas clear. PERRL.  Sclera anicteric. Throat: lips, mucosa, and tongue normal; without oral thrush or ulcers. Resp: clear to auscultation bilaterally without rhonchi, wheezes or dullness to percussion. Cardio: regular rate and rhythm, S1, S2 normal, no murmur, click, rub or gallop GI: soft, non-tender; bowel sounds normal; no masses,  no organomegaly Skin: Skin color, texture, turgor normal. No rashes or lesions Lymph nodes: Cervical, supraclavicular, and axillary nodes normal. Neurologic: Grossly normal without any motor, sensory or deep tendon reflexes. Musculoskeletal: No joint deformity or effusion.    Assessment and Plan:    54 year old man with:  1.  Polycythemia detected that as far back as 2019 with a hemoglobin in the range of 17-18 and hematocrit close to 50.  He has normal white cell count of his platelet count otherwise.  The differential diagnosis of these findings were reviewed.  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  Obstructive sleep apnea or carbon monoxide exposure among others a consideration less likely. Myeloproliferative disorder such as polycythemia vera is a consideration as well but appeared to be less likely at this time.  From a management standpoint, I would like to evaluate him fully for polycythemia vera and a repeat CBC.  If his hematocrit continues to be above 45 consideration for phlebotomy would be discussed.  Decreasing testosterone supplementation could also contribute to controlling his hematocrit.  If he has polycythemia vera, or regular phlebotomy may be required.  Consideration for sleep apnea and evaluation by a sleep study will be recommended if he has persistent elevation in his hemoglobin and no polycythemia vera.  2.  Follow-up: Will be determined pending results of his  work-up.   45  minutes were dedicated to this visit. The time was spent on reviewing laboratory data, plan of discussing treatment options, discussing differential diagnosis and answering questions regarding future plan.     A copy of this consult has been forwarded to the requesting physician.

## 2020-01-11 LAB — ERYTHROPOIETIN: Erythropoietin: 12.9 m[IU]/mL (ref 2.6–18.5)

## 2020-01-17 LAB — JAK2 (INCLUDING V617F AND EXON 12), MPL,& CALR-NEXT GEN SEQ

## 2020-02-18 ENCOUNTER — Encounter: Payer: Self-pay | Admitting: Oncology

## 2020-03-03 DIAGNOSIS — E109 Type 1 diabetes mellitus without complications: Secondary | ICD-10-CM | POA: Diagnosis not present

## 2020-03-03 DIAGNOSIS — Z125 Encounter for screening for malignant neoplasm of prostate: Secondary | ICD-10-CM | POA: Diagnosis not present

## 2020-03-03 DIAGNOSIS — E785 Hyperlipidemia, unspecified: Secondary | ICD-10-CM | POA: Diagnosis not present

## 2020-03-03 DIAGNOSIS — Z0000 Encounter for general adult medical examination without abnormal findings: Secondary | ICD-10-CM | POA: Diagnosis not present

## 2020-03-10 DIAGNOSIS — R82998 Other abnormal findings in urine: Secondary | ICD-10-CM | POA: Diagnosis not present

## 2020-03-10 DIAGNOSIS — I10 Essential (primary) hypertension: Secondary | ICD-10-CM | POA: Diagnosis not present

## 2020-03-10 DIAGNOSIS — Z0000 Encounter for general adult medical examination without abnormal findings: Secondary | ICD-10-CM | POA: Diagnosis not present

## 2020-03-10 DIAGNOSIS — E109 Type 1 diabetes mellitus without complications: Secondary | ICD-10-CM | POA: Diagnosis not present

## 2020-03-10 DIAGNOSIS — Z1339 Encounter for screening examination for other mental health and behavioral disorders: Secondary | ICD-10-CM | POA: Diagnosis not present

## 2020-03-10 DIAGNOSIS — Z1331 Encounter for screening for depression: Secondary | ICD-10-CM | POA: Diagnosis not present

## 2020-03-11 DIAGNOSIS — H40013 Open angle with borderline findings, low risk, bilateral: Secondary | ICD-10-CM | POA: Diagnosis not present

## 2020-04-07 ENCOUNTER — Encounter: Payer: Self-pay | Admitting: Neurology

## 2020-04-07 ENCOUNTER — Other Ambulatory Visit: Payer: Self-pay | Admitting: Neurology

## 2020-04-08 ENCOUNTER — Ambulatory Visit: Payer: BC Managed Care – PPO | Admitting: Neurology

## 2020-04-08 ENCOUNTER — Encounter: Payer: Self-pay | Admitting: Neurology

## 2020-04-08 VITALS — BP 128/84 | HR 74 | Ht 70.0 in | Wt 188.0 lb

## 2020-04-08 DIAGNOSIS — D582 Other hemoglobinopathies: Secondary | ICD-10-CM | POA: Diagnosis not present

## 2020-04-08 NOTE — Progress Notes (Addendum)
SLEEP MEDICINE CLINIC    Provider:  Larey Seat, MD  Primary Care Physician:  Prince Solian, MD [place] [address]     Referring Provider: Prince Solian, [place] [id number]          Chief Complaint according to patient   Patient presents with:    . New Patient (Initial Visit)     Pt alone, rm 11. Presents today due to having increase Hgb levels. He has seen a hematologist and all test completed through them were negative.  They recommended  to eval for OSA. Agv 7/8 hrs of sleep at night, may wake up 1-2 times to void, typically able to fall back asleep. Never had a SS. Admits to snoring.      HISTORY OF PRESENT ILLNESS:  Steven Hoffman is a 55 - year -old  Caucasian male patient who is seen here upon a referral on 04/08/2020 from Dr. Dagmar Hait, MD, PhD, concerned about elevated Hgb levels. The patient reports that he has never been as smoker, he does not have a history of asthma or exercise-induced respiratory problems.  He does not travel to high altitudes and has not done so recently.  The elevated hemoglobin has thus far been unexplained but a hematological reason with an genetic means has not been found.  One question is if the patient may have silent hypoxemia at night which can be related to an undiagnosed sleep apnea.  Steven Hoffman is aware of his snoring but he has not been told that he frequently quits breathing during sleep.  He does not report morning headaches.  Chief concern according to patient : " no answer found yet"    Steven Hoffman  has a past medical history of Diabetes mellitus type 1 at age 41 and his son was diagnosed at age 54, without complication ([place]), HLD (hyperlipidemia), and Hypertension. Not on insulin pump.     Sleep relevant medical history: Nocturia, 1-2 , dreams frequently on melatonin,. Family medical /sleep history: NO other family member on CPAP with OSA, insomnia, sleep walkers.    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  Pets are present, one dog. Tobacco use- never .  ETOH use - wine-regular with dinner. Caffeine intake in form of Coffee( 2 cups in AM  ) Soda( /) Tea (/) or energy drinks. Regular exercise in form of walking, yoga, peleton, .     Sleep habits are as follows: The patient's dinner time is between 6.30 PM. The patient goes to bed at 9.30 PM and continues to sleep for 7 hours, wakes for 1-2 bathroom breaks, the first time at 3 AM.   The preferred sleep position is  Supine , with the support of 1 pillow.  Dreams are reportedly frequent/vivid- since melatonin.  6-7  AM is the usual rise time. The patient wakes up spontaneously. He reports feeling refreshed or restored in AM, with symptoms such as naps are not taken.    Review of Systems: Out of a complete 14 system review, the patient complains of only the following symptoms, and all other reviewed systems are negative.:  snoring, 2 times nocturia, no RLS.    How likely are you to doze in the following situations:  0 = not likely, 1 = slight chance, 2 = moderate chance, 3 = high chance   Sitting and Reading? Watching Television? Sitting inactive in a public place (theater or meeting)? As a passenger in a car for an hour without a break? Lying down in the afternoon when circumstances permit? Sitting and talking to someone? Sitting quietly after lunch without alcohol? In a car, while stopped for a few minutes in traffic?   Total = 2/ 24 points   FSS endorsed at 14 / 63 points.   Social History   Socioeconomic History  . Marital status: Married    Spouse name: Not on file  . Number of children: Not on file  . Years of education: Not on file  . Highest education level: Not on file  Occupational History  . Not on file   Tobacco Use  . Smoking status: Never Smoker  . Smokeless tobacco: Never Used  Substance and Sexual Activity  . Alcohol use: Yes    Alcohol/week: 7.0 - 14.0 standard drinks    Types: 7 - 14 Standard drinks or equivalent per week  . Drug use: Never  . Sexual activity: Not on file  Other Topics Concern  . Not on file  Social History Narrative  . Not on file   Social Determinants of Health   Financial Resource Strain: Not on file  Food Insecurity: Not on file  Transportation Needs: Not on file  Physical Activity: Not on file  Stress: Not on file  Social Connections: Not on file    Family History  Problem Relation Age of Onset  . Hypothyroidism Mother   . Nephrolithiasis Father   . Prostate cancer Father   . Heart disease Father     Past Medical History:  Diagnosis Date  . Diabetes mellitus without complication ([place])   . HLD (hyperlipidemia)   . Hypertension      Current Outpatient Medications on File Prior to Visit  Medication Sig Dispense Refill  . insulin aspart (NOVOLOG) 100 UNIT/ML injection Inject 12-20 Units into the skin 3 (three) times daily before meals.    Marland Kitchen MELATONIN PO Take 6 mg by mouth at bedtime.    . sildenafil (VIAGRA) 25 MG tablet Take 25 mg by mouth daily as needed for erectile dysfunction.    Marland Kitchen telmisartan (MICARDIS) 40 MG tablet Take 40 mg by mouth daily.    Tyler Aas FLEXTOUCH 100 UNIT/ML FlexTouch Pen Inject 15-20 Units into the skin every morning.     No current facility-administered medications on file prior to visit.    Allergies  Allergen Reactions  . Altace [Ramipril]     Physical exam:  Today's Vitals   04/08/20 1448  BP: 128/84  Pulse: 74  Weight: 188 lb (85.3 kg)  Height: 5\' 10"  (1.778 m)   Body mass index is 26.98 kg/m.   Wt Readings from Last 3 Encounters:  04/08/20 188 lb (85.3 kg)  01/10/20 191 lb 8 oz (86.9 kg)  12/01/16 194 lb 9.6 oz (88.3 kg)     Ht Readings from Last 3 Encounters:  04/08/20 5\' 10"  (1.778  m)      General: The patient is awake, alert and appears not in acute distress.  The patient is well groomed. Head: Normocephalic, atraumatic.  Neck is supple. Mallampati 1  ,  neck circumference: 17 inches . Nasal airflow patent.   Retrognathia is not seen.  Dental status:  Cardiovascular:  Regular rate and cardiac rhythm by pulse,  without  distended neck veins. Respiratory: Lungs are clear to auscultation.  Skin:  Without evidence of ankle edema, or rash. Trunk: The patient's posture is erect.   Neurologic exam : The patient is awake and alert, oriented to place and time.   Memory subjective described as intact.  Attention span & concentration ability appears normal.  Speech is fluent without dysarthria, dysphonia or aphasia.  Mood and affect are appropriate.   Cranial nerves: no loss of smell or taste reported  Pupils are equal and briskly reactive to light. Funduscopic exam deferred.   Extraocular movements in vertical and horizontal planes were intact and without nystagmus. No Diplopia. Visual fields by finger perimetry are intact. Hearing was intact to soft voice and finger rubbing.    Facial sensation intact to fine touch.  Facial motor strength is symmetric and tongue and uvula move midline.  Neck ROM : rotation, tilt and flexion extension were normal for age and shoulder shrug was symmetrical.    Motor exam:  Symmetric bulk, tone and ROM.   Normal tone without cog wheeling, symmetric grip strength .   Sensory:  Fine touch and vibration were  normal.  Proprioception tested in the upper extremities was normal.   Coordination: Rapid alternating movements in the fingers/hands were of normal speed.  The Finger-to-nose maneuver was intact without evidence of ataxia, dysmetria or tremor.   Gait and station: Patient could rise unassisted from a seated position, walked without assistive device.  Stance is of normal width/ base.  Toe and heel walk were deferred.  Deep tendon  reflexes: in the  upper and lower extremities are symmetric and intact.  Babinski response was deferred.        After spending a total time of  35 minutes face to face and additional time for physical and neurologic examination, review of laboratory studies,  personal review of imaging studies, reports and results of other testing and review of referral information / records as far as provided in visit, I have established the following assessments:  1) at the pleasure of seeing Steven Hoffman here today for a brief visit he has no subjective complaints about his sleep.  His wife has mentioned to him that he occasionally sleeps with a snoring accompaniment.  He typically sleeps 7 sometimes 8 hours at night interrupted by 1-2 bathroom breaks.  There is no history of neuropathy, no history of restless legs and he has never been told that apnea spoke witnessed.  So looking back at his elevated hemoglobin levels what we are trying to do is to evaluate him for supple changes in his nocturnal oxygenation that may have led to an overproduction of hemoglobin.  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 Labs indicate a higher Hgb for at least 2 years    2) patient has hepatitis B ab but was never vaccinated. He has no longer been able to donate blood.  3 ) Bilirubin elevated.     My Plan is to proceed with:  1)  HST , on watch pat. RV in 1-2 month by video if abnormal -If normal , no RV is needed.    I would like to thank Prince Solian, MD and Prince Solian, [place],   [id number] for allowing me to meet with and to take care of this pleasant patient.    Electronically signed by: Larey Seat, MD 04/08/2020 3:05 PM  [facility] [employer] Board certified by The [employer] of Sleep Medicine and Diplomate  of the American Academy of Sleep Medicine. Board certified In Neurology through the [place], Fellow of the [employer] of  Neurology. Medical Director of [employer].

## 2020-04-08 NOTE — Patient Instructions (Signed)

## 2020-05-21 ENCOUNTER — Ambulatory Visit (INDEPENDENT_AMBULATORY_CARE_PROVIDER_SITE_OTHER): Payer: BC Managed Care – PPO | Admitting: Neurology

## 2020-05-21 DIAGNOSIS — D582 Other hemoglobinopathies: Secondary | ICD-10-CM

## 2020-05-21 DIAGNOSIS — R718 Other abnormality of red blood cells: Secondary | ICD-10-CM

## 2020-05-21 DIAGNOSIS — G4733 Obstructive sleep apnea (adult) (pediatric): Secondary | ICD-10-CM | POA: Diagnosis not present

## 2020-05-22 NOTE — Progress Notes (Signed)
[facility]  HOME SLEEP TEST (Watch PAT)  STUDY DATE: 05/22/20  DOB: Sep 06, 1965  MRN: [id number]  ORDERING CLINICIAN: Larey Seat, MD   REFERRING CLINICIAN: Prince Solian, MD   CLINICAL INFORMATION/HISTORY: Steven Hoffman, scheel a 55 - year -old Caucasian male patientwho is seen here upon a referralon 04/08/2020 from Dr. Dagmar Hait, MD, PhD, concerned about elevated Hgb levels. The patient reports that he has never been a smoker, he does not have a history of asthma or exercise-induced respiratory problems. There was no travel to high altitudes recently.  The elevated hemoglobin has thus far been unexplained , a hematological reason / genetic trait has not been found.  One question is if the patient may have silent hypoxemia at night which can be related to an undiagnosed sleep apnea.  Mr. Cruces is aware of his snoring but he has not been told that he quits breathing during sleep.  He does not report morning headaches.  Chiefconcernaccording to patient : "no answer found yet"  Steven Hoffman has a medical history of Diabetes mellitus type 1 at age 50 and his son was diagnosed with diabetes at age 8, without complication ([place]), HLD (hyperlipidemia), and Hypertension. Not on insulin pump.   Epworth sleepiness score: 2/24.  BMI: 26.8 kg/m  Neck Circumference: 17 "  FINDINGS:   Total Record Time (hours, min): 6 h 25 min Total Sleep Time (hours, min):  5 h 30 min   Percent REM (%):    26.91 %   Calculated pAHI (per hour):  22.0     REM pAHI: 42.5   NREM pAHI: 15.0 Supine AHI: 38.0  Oxygen Saturation (%) Mean:  93  Minimum oxygen saturation (%):        83   O2 Saturation Range (%): 83-98  O2Saturation (minutes) <=88%: 0.1  Pulse Mean (bpm):    69  Pulse Range (45-110)   IMPRESSION: This HST indicated the presence of moderately severe OSA (obstructive sleep apnea) with strong REM sleep exacerbation. The supine sleep position also added risk for more  apneas and hypopneas.  Snoring volume was indicated as loud. No clinically relevant degree of hypoxemia was documented.   RECOMMENDATION: This sleep apnea degree should be treated - 1) CPAP works best for REM sleep dependent apnea which is related to hypoventilation during REM sleep. Auto CPAP pressure setting from 5-18 cm water , with 3 cm EPR and interface of this patient's choice- and  heated humidification to be used. 2) while waiting for a CPAP device, avoid supine sleep.  3) the mild hypoxemia during the test night was not considered clinically relevant - hypoxemia may still be present when sleeping soon after alcohol intake, muscle relaxants, or sedatives at bedtime.   INTERPRETING PHYSICIAN:  Larey Seat, MD  Board Certified in Neurology and Sleep Medicine  [facility] [address], [place], [place] [address] [phone]     Technical Data    Sleep Summary  Oxygen Saturation Statistics   Start Study Time: End Study Time: Total Recording Time:  9:52:49 PM 4:18:00 AM 6 h, 25 min  Total Sleep Time % REM of Sleep Time:  5 h, 30 min  26.9    Mean: 93 Minimum: 83 Maximum: 98  Mean of Desaturations Nadirs (%):   91  Oxygen Desatur. %:  4-9 10-20 >20 Total  Events Number Total   57  3 95.0 5.0  0 0.0  60 100.0  Oxygen Saturation: <90 <=88 <  85 <80 <70  Duration (minutes): Sleep % 1.6 0.5 0.8 0.1 0.2 0.0 0.0 0.0 0.0 0.0     Respiratory Indices      Total Events REM NREM All Night  pRDI: pAHI 3%: ODI 4%: pAHIc 3%: % CSR: pAHI 4%: 129 116 60  1 0.0 76 44.7 42.5 20.9 0.8 17.5 15.0 8.1 0.0 24.4 22.0 11.4 0.2 0 14.4       Pulse Rate Statistics during Sleep (BPM)      Mean: 69 Minimum: 45 Maximum: 110    Indices are calculated using technically valid sleep time of  5 hrs, 17 min.                                     pAHI=22.0                                  _             Mild              Moderate                     Severe  Body Position Statistics  Position Supine Prone Right Left Non-Supine  Sleep (min) 157.5 0.0 102.7 70.5 173.2  Sleep % 47.6 0.0 31.1 21.3 52.4  pRDI 40.4 N/A 13.5 6.2 10.6  pAHI 3% 38.0 N/A 10.0 5.3 8.1  ODI 4% 22.1 N/A 1.8 2.7 2.1            Left   Right  Supine    Snoring Statistics Snoring Level (dB) >40 >50 >60 >70 >80 >Threshold (45)  Sleep (min) 25.5 1.1 0.4 0.0 0.0 3.6  Sleep % 7.7 0.3 0.1 0.0 0.0 1.1    Mean: 40 dB

## 2020-05-27 ENCOUNTER — Encounter: Payer: Self-pay | Admitting: Neurology

## 2020-06-03 ENCOUNTER — Encounter: Payer: Self-pay | Admitting: Neurology

## 2020-06-03 ENCOUNTER — Telehealth: Payer: Self-pay | Admitting: Neurology

## 2020-06-03 NOTE — Telephone Encounter (Signed)
I called pt. I advised pt that Dr. Brett Fairy reviewed their sleep study results and found that pt has moderate to severe sleep apnea. Dr. Brett Fairy recommends that pt starts auto CPAP. I reviewed PAP compliance expectations with the pt. Pt is agreeable to starting a CPAP. I advised pt that an order will be sent to a DME, [facility], and [facility] will call the pt within about one week after they file with the pt's insurance. [facility] will show the pt how to use the machine, fit for masks, and troubleshoot the CPAP if needed. A follow up appt was made for insurance purposes with Debbora Presto, NP on Aug 29,2022 at 11:00 am. Pt verbalized understanding to arrive 15 minutes early and bring their CPAP. A letter with all of this information in it will be mailed to the pt as a reminder. I verified with the pt that the address we have on file is correct. Pt verbalized understanding of results. Pt had no questions at this time but was encouraged to call back if questions arise. I have sent the order to [facility] and have received confirmation that they have received the order.

## 2020-06-03 NOTE — Addendum Note (Signed)
Addended by: Larey Seat on: 06/03/2020 01:17 PM   Modules accepted: Orders

## 2020-06-03 NOTE — Progress Notes (Signed)
IMPRESSION: This HST indicated the presence of moderately severe OSA (obstructive sleep apnea) with strong REM sleep exacerbation. The supine sleep position also added risk for more apneas and hypopneas.  Snoring volume was indicated as loud. No clinically relevant degree of hypoxemia was documented.   RECOMMENDATION: This sleep apnea degree should be treated - 1) CPAP works best for REM sleep dependent apnea which is related to hypoventilation during REM sleep. Auto CPAP pressure setting from 5-18 cm water , with 3 cm EPR and interface of this patient's choice- and  heated humidification to be used. 2) while waiting for a CPAP device, avoid supine sleep.  3) the mild hypoxemia during the test night was not considered clinically relevant - hypoxemia may still be present when sleeping soon after alcohol intake, muscle relaxants, or use of sedatives at bedtime.   INTERPRETING PHYSICIAN:  Larey Seat, MD  Board Certified in Neurology and Sleep Medicine

## 2020-06-03 NOTE — Telephone Encounter (Signed)
CPAP order sent to [facility]

## 2020-06-03 NOTE — Telephone Encounter (Signed)
-----   Message from Larey Seat, MD sent at 06/03/2020  1:17 PM EDT ----- IMPRESSION: This HST indicated the presence of moderately severe OSA (obstructive sleep apnea) with strong REM sleep exacerbation. The supine sleep position also added risk for more apneas and hypopneas.  Snoring volume was indicated as loud. No clinically relevant degree of hypoxemia was documented.   RECOMMENDATION: This sleep apnea degree should be treated - 1) CPAP works best for REM sleep dependent apnea which is related to hypoventilation during REM sleep. Auto CPAP pressure setting from 5-18 cm water , with 3 cm EPR and interface of this patient's choice- and  heated humidification to be used. 2) while waiting for a CPAP device, avoid supine sleep.  3) the mild hypoxemia during the test night was not considered clinically relevant - hypoxemia may still be present when sleeping soon after alcohol intake, muscle relaxants, or use of sedatives at bedtime.   INTERPRETING PHYSICIAN:  Larey Seat, MD  Board Certified in Neurology and Sleep Medicine

## 2020-06-03 NOTE — Procedures (Signed)
 [facility]  HOME SLEEP TEST (Watch PAT)  STUDY DATE: 05/22/20  DOB: 06/09/1965  MRN: [id number]  ORDERING CLINICIAN: Jrue Jarriel, MD   REFERRING CLINICIAN: Avva, Ravisankar, MD   CLINICAL INFORMATION/HISTORY: Mr. Steven Hoffman,is a 55 - year -old Caucasian male patientwho is seen here upon a referralon 04/08/2020 from Dr. Avva, MD, PhD, concerned about elevated Hgb levels. The patient reports that he has never been a smoker, he does not have a history of asthma or exercise-induced respiratory problems. There was no travel to high altitudes recently.  The elevated hemoglobin has thus far been unexplained , a hematological reason / genetic trait has not been found.  One question is if the patient may have silent hypoxemia at night which can be related to an undiagnosed sleep apnea.  [name] is aware of his snoring but he has not been told that he quits breathing during sleep.  He does not report morning headaches.  Chiefconcernaccording to patient : "no answer found yet"  Ausar B Hegstrom has a medical history of Diabetes mellitus type 1 at age 28 and his son was diagnosed with diabetes at age 12, without complication ([facility]), HLD (hyperlipidemia), and Hypertension. Not on insulin pump.   Epworth sleepiness score: 2/24.  BMI: 26.8 kg/m  Neck Circumference: 17 "  FINDINGS:   Total Record Time (hours, min): 6 h 25 min Total Sleep Time (hours, min):  5 h 30 min   Percent REM (%):    26.91 %   Calculated pAHI (per hour):  22.0     REM pAHI: 42.5   NREM pAHI: 15.0 Supine AHI: 38.0  Oxygen Saturation (%) Mean:  93  Minimum oxygen saturation (%):        83   O2 Saturation Range (%): 83-98  O2Saturation (minutes) <=88%: 0.1  Pulse Mean (bpm):    69  Pulse Range (45-110)   IMPRESSION: This HST indicated the presence of moderately severe OSA (obstructive sleep apnea) with strong REM sleep exacerbation. The supine sleep position also added risk for more  apneas and hypopneas.  Snoring volume was indicated as loud. No clinically relevant degree of hypoxemia was documented.   RECOMMENDATION: This sleep apnea degree should be treated - 1) CPAP works best for REM sleep dependent apnea which is related to hypoventilation during REM sleep. Auto CPAP pressure setting from 5-18 cm water , with 3 cm EPR and interface of this patient's choice- and  heated humidification to be used. 2) while waiting for a CPAP device, avoid supine sleep.  3) the mild hypoxemia during the test night was not considered clinically relevant - hypoxemia may still be present when sleeping soon after alcohol intake, muscle relaxants, or sedatives at bedtime.   INTERPRETING PHYSICIAN:  Patina Spanier, MD  Board Certified in Neurology and Sleep Medicine  [facility] [address], [address] [place], [place] [address] [phone]     Technical Data    Sleep Summary  Oxygen Saturation Statistics   Start Study Time: End Study Time: Total Recording Time:  9:52:49 PM 4:18:00 AM 6 h, 25 min  Total Sleep Time % REM of Sleep Time:  5 h, 30 min  26.9    Mean: 93 Minimum: 83 Maximum: 98  Mean of Desaturations Nadirs (%):   91  Oxygen Desatur. %:  4-9 10-20 >20 Total  Events Number Total   57  3 95.0 5.0  0 0.0  60 100.0  Oxygen Saturation: <90 <=88 <  85 <80 <70  Duration (minutes): Sleep % 1.6 0.5 0.8 0.1 0.2 0.0 0.0 0.0 0.0 0.0     Respiratory Indices      Total Events REM NREM All Night  pRDI: pAHI 3%: ODI 4%: pAHIc 3%: % CSR: pAHI 4%: 129 116 60  1 0.0 76 44.7 42.5 20.9 0.8 17.5 15.0 8.1 0.0 24.4 22.0 11.4 0.2 0 14.4       Pulse Rate Statistics during Sleep (BPM)      Mean: 69 Minimum: 45 Maximum: 110    Indices are calculated using technically valid sleep time of  5 hrs, 17 min.                                     pAHI=22.0                                  _             Mild              Moderate                     Severe  Body Position Statistics  Position Supine Prone Right Left Non-Supine  Sleep (min) 157.5 0.0 102.7 70.5 173.2  Sleep % 47.6 0.0 31.1 21.3 52.4  pRDI 40.4 N/A 13.5 6.2 10.6  pAHI 3% 38.0 N/A 10.0 5.3 8.1  ODI 4% 22.1 N/A 1.8 2.7 2.1            Left   Right  Supine    Snoring Statistics Snoring Level (dB) >40 >50 >60 >70 >80 >Threshold (45)  Sleep (min) 25.5 1.1 0.4 0.0 0.0 3.6  Sleep % 7.7 0.3 0.1 0.0 0.0 1.1    Mean: 40 dB      

## 2020-06-26 DIAGNOSIS — H401131 Primary open-angle glaucoma, bilateral, mild stage: Secondary | ICD-10-CM | POA: Diagnosis not present

## 2020-07-08 DIAGNOSIS — I10 Essential (primary) hypertension: Secondary | ICD-10-CM | POA: Diagnosis not present

## 2020-07-08 DIAGNOSIS — E109 Type 1 diabetes mellitus without complications: Secondary | ICD-10-CM | POA: Diagnosis not present

## 2020-07-31 DIAGNOSIS — H401131 Primary open-angle glaucoma, bilateral, mild stage: Secondary | ICD-10-CM | POA: Diagnosis not present

## 2020-08-02 ENCOUNTER — Encounter: Payer: Self-pay | Admitting: Neurology

## 2020-08-02 DIAGNOSIS — S90562A Insect bite (nonvenomous), left ankle, initial encounter: Secondary | ICD-10-CM | POA: Diagnosis not present

## 2020-08-02 DIAGNOSIS — W57XXXA Bitten or stung by nonvenomous insect and other nonvenomous arthropods, initial encounter: Secondary | ICD-10-CM | POA: Diagnosis not present

## 2020-08-19 DIAGNOSIS — G4733 Obstructive sleep apnea (adult) (pediatric): Secondary | ICD-10-CM | POA: Diagnosis not present

## 2020-09-19 DIAGNOSIS — G4733 Obstructive sleep apnea (adult) (pediatric): Secondary | ICD-10-CM | POA: Diagnosis not present

## 2020-10-02 NOTE — Progress Notes (Signed)
PATIENT: Steven Hoffman DOB: 12-12-1965  REASON FOR VISIT: follow up HISTORY FROM: patient  Chief Complaint  Patient presents with   Obstructive Sleep Apnea    New rm, alone. Here for initial cpap f/u, pt reports doing well w/ his new cpap machine.      HISTORY OF PRESENT ILLNESS: 10/06/20 ALL:  Steven Hoffman is a 55 y.o. male here today for follow up for OSA on CPAP.  HST 05/2020 showed moderate/severe OSA with AHI 22/hr and REM AHI 42.5/hr. AutoPAP was ordered. He feels that he is doing very well. He was not able to tolerate first CPAP machine but recently received a ResMed machine and is doing great. He has not noticed any significant difference in how he sleeps but reports he slept well previously. His blood pressure has been normal. He has not returned to PCP yet for updated blood work. He is using a nasal mask and tolerating well.     HISTORY: (copied from Dr Dohmeier's previous note)  Steven Hoffman is a 55 - year -old  Caucasian male patient who is seen here upon a referral on 04/08/2020 from Dr. Dagmar Hait, MD, PhD, concerned about elevated Hgb levels. The patient reports that he has never been as smoker, he does not have a history of asthma or exercise-induced respiratory problems.  He does not travel to high altitudes and has not done so recently.  The elevated hemoglobin has thus far been unexplained but a hematological reason with an genetic means has not been found.  One question is if the patient may have silent hypoxemia at night which can be related to an undiagnosed sleep apnea.  Steven Hoffman is aware of his snoring but he has not been told that he frequently quits breathing during sleep.  He does not report morning headaches.  Chief concern according to patient : " no answer found yet"   Steven Hoffman  has a past medical history of Diabetes mellitus type 1 at age 61 and his son was diagnosed at age 109, without complication ([place]), HLD (hyperlipidemia), and Hypertension. Not on  insulin pump.     Sleep relevant medical history: Nocturia, 1-2 , dreams frequently on melatonin,. Family medical /sleep history: NO other family member on CPAP with OSA, insomnia, sleep walkers.    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  Pets are present, one dog. Tobacco use- never .  ETOH use - wine-regular with dinner. Caffeine intake in form of Coffee( 2 cups in AM  ) Soda( /) Tea (/) or energy drinks. Regular exercise in form of walking, yoga, peleton, .     Sleep habits are as follows: The patient's dinner time is between 6.30 PM. The patient goes to bed at 9.30 PM and continues to sleep for 7 hours, wakes for 1-2 bathroom breaks, the first time at 3 AM.   The preferred sleep position is  Supine , with the support of 1 pillow.  Dreams are reportedly frequent/vivid- since melatonin.  6-7  AM is the usual rise time. The patient wakes up spontaneously. He reports feeling refreshed or restored in AM, with symptoms such as naps are not taken.    REVIEW OF SYSTEMS: Out of a complete 14 system review of symptoms, the patient complains  only of the following symptoms, none and all other reviewed systems are negative.  ESS: 3  ALLERGIES: Allergies  Allergen Reactions   Altace [Ramipril]     HOME MEDICATIONS: Outpatient Medications Prior to Visit  Medication Sig Dispense Refill   insulin aspart (NOVOLOG) 100 UNIT/ML injection Inject 12-20 Units into the skin 3 (three) times daily before meals.     MELATONIN PO Take 6 mg by mouth at bedtime.     sildenafil (VIAGRA) 25 MG tablet Take 25 mg by mouth daily as needed for erectile dysfunction.     telmisartan (MICARDIS) 40 MG tablet Take 40 mg by mouth daily.     TRESIBA FLEXTOUCH 100 UNIT/ML FlexTouch Pen Inject 15-20 Units into the skin every morning.      No facility-administered medications prior to visit.    PAST MEDICAL HISTORY: Past Medical History:  Diagnosis Date   Diabetes mellitus without complication (HCC)    HLD (hyperlipidemia)    Hypertension     PAST SURGICAL HISTORY: Past Surgical History:  Procedure Laterality Date   PARTIAL NEPHRECTOMY Right 1980    FAMILY HISTORY: Family History  Problem Relation Age of Onset   Hypothyroidism Mother    Nephrolithiasis Father    Prostate cancer Father    Heart disease Father     SOCIAL HISTORY: Social History   Socioeconomic History   Marital status: Married    Spouse name: Not on file   Number of children: Not on file   Years of education: Not on file   Highest education level: Not on file  Occupational History   Not on file  Tobacco Use   Smoking status: Never   Smokeless tobacco: Never  Substance and Sexual Activity   Alcohol use: Yes    Alcohol/week: 7.0 - 14.0 standard drinks    Types: 7 - 14 Standard drinks or equivalent per week   Drug use: Never   Sexual activity: Not on file  Other Topics Concern   Not on file  Social History Narrative   Not on file   Social Determinants of Health   Financial Resource Strain: Not on file  Food Insecurity: Not on file  Transportation Needs: Not on file  Physical Activity: Not on file  Stress: Not on file  Social Connections: Not on file  Intimate Partner Violence: Not on file     PHYSICAL EXAM  Vitals:   10/06/20 1039  BP: 134/86  Pulse: 60  Weight: 190 lb 12.8 oz (86.5 kg)  Height: '5\' 10"'$  (1.778 m)   Body mass index is 27.38 kg/m.  Generalized: Well developed, in no acute distress  Cardiology: normal rate and rhythm, no murmur noted Respiratory: clear to auscultation bilaterally  Neurological examination  Mentation: Alert oriented to time, place, history taking. Follows all commands speech and language fluent Cranial nerve II-XII: Pupils were equal round reactive to light. Extraocular  movements were full, visual field were full  Motor: The motor testing reveals 5 over 5 strength of all 4 extremities. Good symmetric motor tone is noted throughout.  Gait and station: Gait is normal.    DIAGNOSTIC DATA (LABS, IMAGING, TESTING) - I reviewed patient records, labs, notes, testing and imaging myself where available.  No flowsheet data found.   Lab Results  Component Value Date   WBC 4.6 01/10/2020   HGB 16.9 01/10/2020   HCT 47.9 01/10/2020   MCV 91.1 01/10/2020   PLT 170 01/10/2020      Component Value Date/Time  NA 137 01/10/2020 1149   K 4.2 01/10/2020 1149   CL 104 01/10/2020 1149   CO2 26 01/10/2020 1149   GLUCOSE 123 (H) 01/10/2020 1149   BUN 17 01/10/2020 1149   CREATININE 1.11 01/10/2020 1149   CALCIUM 9.2 01/10/2020 1149   PROT 7.1 01/10/2020 1149   ALBUMIN 4.1 01/10/2020 1149   AST 26 01/10/2020 1149   ALT 35 01/10/2020 1149   ALKPHOS 89 01/10/2020 1149   BILITOT 1.1 01/10/2020 1149   GFRNONAA >60 01/10/2020 1149   No results found for: CHOL, HDL, LDLCALC, LDLDIRECT, TRIG, CHOLHDL No results found for: HGBA1C No results found for: VITAMINB12 No results found for: TSH   ASSESSMENT AND PLAN 55 y.o. year old male  has a past medical history of Diabetes mellitus without complication ([place]), HLD (hyperlipidemia), and Hypertension. here with     ICD-10-CM   1. OSA on CPAP  G47.33    Z99.89         NESTOR MARANO is doing well on CPAP therapy. Compliance report reveals excellent compliance. He was encouraged to continue using CPAP nightly and for greater than 4 hours each night. Risks of untreated sleep apnea review and education materials provided. He will continue close follow up with PCP for lab monitoring. Healthy lifestyle habits encouraged. He will follow up in 1 year, sooner if needed. He verbalizes understanding and agreement with this plan.    No orders of the defined types were placed in this encounter.    No orders of the defined  types were placed in this encounter.     Debbora Presto, FNP-C 10/06/2020, 10:53 AM [facility] [address] [place], [place] [address] [phone]

## 2020-10-02 NOTE — Patient Instructions (Signed)
Please continue using your CPAP regularly. While your insurance requires that you use CPAP at least 4 hours each night on 70% of the nights, I recommend, that you not skip any nights and use it throughout the night if you can. Getting used to CPAP and staying with the treatment long term does take time and patience and discipline. Untreated obstructive sleep apnea when it is moderate to severe can have an adverse impact on cardiovascular health and raise her risk for heart disease, arrhythmias, hypertension, congestive heart failure, stroke and diabetes. Untreated obstructive sleep apnea causes sleep disruption, nonrestorative sleep, and sleep deprivation. This can have an impact on your day to day functioning and cause daytime sleepiness and impairment of cognitive function, memory loss, mood disturbance, and problems focussing. Using CPAP regularly can improve these symptoms.  Continue follow up with PCP for labs. Follow up with me in 1 year

## 2020-10-06 ENCOUNTER — Encounter: Payer: Self-pay | Admitting: Family Medicine

## 2020-10-06 ENCOUNTER — Ambulatory Visit: Payer: BC Managed Care – PPO | Admitting: Family Medicine

## 2020-10-06 VITALS — BP 134/86 | HR 60 | Ht 70.0 in | Wt 190.8 lb

## 2020-10-06 DIAGNOSIS — G4733 Obstructive sleep apnea (adult) (pediatric): Secondary | ICD-10-CM

## 2020-10-06 DIAGNOSIS — Z9989 Dependence on other enabling machines and devices: Secondary | ICD-10-CM | POA: Diagnosis not present

## 2020-10-07 ENCOUNTER — Encounter: Payer: Self-pay | Admitting: Family Medicine

## 2020-10-20 DIAGNOSIS — G4733 Obstructive sleep apnea (adult) (pediatric): Secondary | ICD-10-CM | POA: Diagnosis not present

## 2020-11-17 DIAGNOSIS — I10 Essential (primary) hypertension: Secondary | ICD-10-CM | POA: Diagnosis not present

## 2020-11-17 DIAGNOSIS — Z23 Encounter for immunization: Secondary | ICD-10-CM | POA: Diagnosis not present

## 2020-11-17 DIAGNOSIS — E109 Type 1 diabetes mellitus without complications: Secondary | ICD-10-CM | POA: Diagnosis not present

## 2020-11-19 DIAGNOSIS — G4733 Obstructive sleep apnea (adult) (pediatric): Secondary | ICD-10-CM | POA: Diagnosis not present

## 2020-11-24 DIAGNOSIS — R1909 Other intra-abdominal and pelvic swelling, mass and lump: Secondary | ICD-10-CM | POA: Diagnosis not present

## 2020-12-12 DIAGNOSIS — H401131 Primary open-angle glaucoma, bilateral, mild stage: Secondary | ICD-10-CM | POA: Diagnosis not present

## 2021-02-04 DIAGNOSIS — H4311 Vitreous hemorrhage, right eye: Secondary | ICD-10-CM | POA: Diagnosis not present

## 2021-02-04 DIAGNOSIS — H35372 Puckering of macula, left eye: Secondary | ICD-10-CM | POA: Diagnosis not present

## 2021-02-04 DIAGNOSIS — H33021 Retinal detachment with multiple breaks, right eye: Secondary | ICD-10-CM | POA: Diagnosis not present

## 2021-02-04 DIAGNOSIS — E109 Type 1 diabetes mellitus without complications: Secondary | ICD-10-CM | POA: Diagnosis not present

## 2021-02-04 DIAGNOSIS — H33311 Horseshoe tear of retina without detachment, right eye: Secondary | ICD-10-CM | POA: Diagnosis not present

## 2021-02-05 DIAGNOSIS — H33001 Unspecified retinal detachment with retinal break, right eye: Secondary | ICD-10-CM | POA: Diagnosis not present

## 2021-02-05 DIAGNOSIS — Z794 Long term (current) use of insulin: Secondary | ICD-10-CM | POA: Diagnosis not present

## 2021-02-05 DIAGNOSIS — H33021 Retinal detachment with multiple breaks, right eye: Secondary | ICD-10-CM | POA: Diagnosis not present

## 2021-02-05 DIAGNOSIS — G4730 Sleep apnea, unspecified: Secondary | ICD-10-CM | POA: Diagnosis not present

## 2021-02-05 DIAGNOSIS — E119 Type 2 diabetes mellitus without complications: Secondary | ICD-10-CM | POA: Diagnosis not present

## 2021-02-05 DIAGNOSIS — I10 Essential (primary) hypertension: Secondary | ICD-10-CM | POA: Diagnosis not present

## 2021-02-25 DIAGNOSIS — H35372 Puckering of macula, left eye: Secondary | ICD-10-CM | POA: Diagnosis not present

## 2021-02-25 DIAGNOSIS — H35412 Lattice degeneration of retina, left eye: Secondary | ICD-10-CM | POA: Diagnosis not present

## 2021-02-25 DIAGNOSIS — H59811 Chorioretinal scars after surgery for detachment, right eye: Secondary | ICD-10-CM | POA: Diagnosis not present

## 2021-02-25 DIAGNOSIS — H43393 Other vitreous opacities, bilateral: Secondary | ICD-10-CM | POA: Diagnosis not present

## 2021-03-06 DIAGNOSIS — G4733 Obstructive sleep apnea (adult) (pediatric): Secondary | ICD-10-CM | POA: Diagnosis not present

## 2021-03-13 DIAGNOSIS — Z125 Encounter for screening for malignant neoplasm of prostate: Secondary | ICD-10-CM | POA: Diagnosis not present

## 2021-03-13 DIAGNOSIS — E109 Type 1 diabetes mellitus without complications: Secondary | ICD-10-CM | POA: Diagnosis not present

## 2021-03-20 DIAGNOSIS — Z0000 Encounter for general adult medical examination without abnormal findings: Secondary | ICD-10-CM | POA: Diagnosis not present

## 2021-03-20 DIAGNOSIS — E109 Type 1 diabetes mellitus without complications: Secondary | ICD-10-CM | POA: Diagnosis not present

## 2021-03-20 DIAGNOSIS — I10 Essential (primary) hypertension: Secondary | ICD-10-CM | POA: Diagnosis not present

## 2021-03-20 DIAGNOSIS — R8281 Pyuria: Secondary | ICD-10-CM | POA: Diagnosis not present

## 2021-04-06 DIAGNOSIS — G4733 Obstructive sleep apnea (adult) (pediatric): Secondary | ICD-10-CM | POA: Diagnosis not present

## 2021-04-10 DIAGNOSIS — H59811 Chorioretinal scars after surgery for detachment, right eye: Secondary | ICD-10-CM | POA: Diagnosis not present

## 2021-04-10 DIAGNOSIS — H43393 Other vitreous opacities, bilateral: Secondary | ICD-10-CM | POA: Diagnosis not present

## 2021-04-10 DIAGNOSIS — E109 Type 1 diabetes mellitus without complications: Secondary | ICD-10-CM | POA: Diagnosis not present

## 2021-04-10 DIAGNOSIS — H33021 Retinal detachment with multiple breaks, right eye: Secondary | ICD-10-CM | POA: Diagnosis not present

## 2021-04-13 DIAGNOSIS — H33021 Retinal detachment with multiple breaks, right eye: Secondary | ICD-10-CM | POA: Diagnosis not present

## 2021-04-13 DIAGNOSIS — H3521 Other non-diabetic proliferative retinopathy, right eye: Secondary | ICD-10-CM | POA: Diagnosis not present

## 2021-04-14 DIAGNOSIS — H33021 Retinal detachment with multiple breaks, right eye: Secondary | ICD-10-CM | POA: Diagnosis not present

## 2021-04-28 DIAGNOSIS — H59811 Chorioretinal scars after surgery for detachment, right eye: Secondary | ICD-10-CM | POA: Diagnosis not present

## 2021-04-28 DIAGNOSIS — H3341 Traction detachment of retina, right eye: Secondary | ICD-10-CM | POA: Diagnosis not present

## 2021-04-28 DIAGNOSIS — H43392 Other vitreous opacities, left eye: Secondary | ICD-10-CM | POA: Diagnosis not present

## 2021-05-01 ENCOUNTER — Other Ambulatory Visit: Payer: Self-pay

## 2021-05-01 ENCOUNTER — Encounter (INDEPENDENT_AMBULATORY_CARE_PROVIDER_SITE_OTHER): Payer: BC Managed Care – PPO | Admitting: Ophthalmology

## 2021-05-01 DIAGNOSIS — H43812 Vitreous degeneration, left eye: Secondary | ICD-10-CM | POA: Diagnosis not present

## 2021-05-01 DIAGNOSIS — H35373 Puckering of macula, bilateral: Secondary | ICD-10-CM | POA: Diagnosis not present

## 2021-05-01 DIAGNOSIS — I10 Essential (primary) hypertension: Secondary | ICD-10-CM | POA: Diagnosis not present

## 2021-05-01 DIAGNOSIS — H338 Other retinal detachments: Secondary | ICD-10-CM | POA: Diagnosis not present

## 2021-05-01 DIAGNOSIS — H2513 Age-related nuclear cataract, bilateral: Secondary | ICD-10-CM

## 2021-05-01 DIAGNOSIS — H35033 Hypertensive retinopathy, bilateral: Secondary | ICD-10-CM

## 2021-05-04 DIAGNOSIS — G4733 Obstructive sleep apnea (adult) (pediatric): Secondary | ICD-10-CM | POA: Diagnosis not present

## 2021-05-05 DIAGNOSIS — H3341 Traction detachment of retina, right eye: Secondary | ICD-10-CM | POA: Diagnosis not present

## 2021-05-05 DIAGNOSIS — H43392 Other vitreous opacities, left eye: Secondary | ICD-10-CM | POA: Diagnosis not present

## 2021-05-05 DIAGNOSIS — H35412 Lattice degeneration of retina, left eye: Secondary | ICD-10-CM | POA: Diagnosis not present

## 2021-05-05 DIAGNOSIS — H33011 Retinal detachment with single break, right eye: Secondary | ICD-10-CM | POA: Diagnosis not present

## 2021-05-05 DIAGNOSIS — T85398A Other mechanical complication of other ocular prosthetic devices, implants and grafts, initial encounter: Secondary | ICD-10-CM | POA: Diagnosis not present

## 2021-05-05 DIAGNOSIS — H35372 Puckering of macula, left eye: Secondary | ICD-10-CM | POA: Diagnosis not present

## 2021-05-18 DIAGNOSIS — H35371 Puckering of macula, right eye: Secondary | ICD-10-CM | POA: Diagnosis not present

## 2021-05-18 DIAGNOSIS — H33021 Retinal detachment with multiple breaks, right eye: Secondary | ICD-10-CM | POA: Diagnosis not present

## 2021-05-18 DIAGNOSIS — H3341 Traction detachment of retina, right eye: Secondary | ICD-10-CM | POA: Diagnosis not present

## 2021-05-19 DIAGNOSIS — H33011 Retinal detachment with single break, right eye: Secondary | ICD-10-CM | POA: Diagnosis not present

## 2021-05-19 DIAGNOSIS — H3341 Traction detachment of retina, right eye: Secondary | ICD-10-CM | POA: Diagnosis not present

## 2021-05-29 DIAGNOSIS — H59811 Chorioretinal scars after surgery for detachment, right eye: Secondary | ICD-10-CM | POA: Diagnosis not present

## 2021-06-05 DIAGNOSIS — H59811 Chorioretinal scars after surgery for detachment, right eye: Secondary | ICD-10-CM | POA: Diagnosis not present

## 2021-06-23 DIAGNOSIS — E109 Type 1 diabetes mellitus without complications: Secondary | ICD-10-CM | POA: Diagnosis not present

## 2021-07-03 DIAGNOSIS — H43392 Other vitreous opacities, left eye: Secondary | ICD-10-CM | POA: Diagnosis not present

## 2021-07-03 DIAGNOSIS — T85398D Other mechanical complication of other ocular prosthetic devices, implants and grafts, subsequent encounter: Secondary | ICD-10-CM | POA: Diagnosis not present

## 2021-07-21 ENCOUNTER — Other Ambulatory Visit: Payer: Self-pay

## 2021-07-21 ENCOUNTER — Ambulatory Visit (INDEPENDENT_AMBULATORY_CARE_PROVIDER_SITE_OTHER): Payer: BC Managed Care – PPO | Admitting: Internal Medicine

## 2021-07-21 DIAGNOSIS — W57XXXA Bitten or stung by nonvenomous insect and other nonvenomous arthropods, initial encounter: Secondary | ICD-10-CM

## 2021-07-21 DIAGNOSIS — I10 Essential (primary) hypertension: Secondary | ICD-10-CM

## 2021-07-21 DIAGNOSIS — E109 Type 1 diabetes mellitus without complications: Secondary | ICD-10-CM | POA: Diagnosis not present

## 2021-07-21 DIAGNOSIS — R61 Generalized hyperhidrosis: Secondary | ICD-10-CM

## 2021-07-21 DIAGNOSIS — S40261A Insect bite (nonvenomous) of right shoulder, initial encounter: Secondary | ICD-10-CM | POA: Diagnosis not present

## 2021-07-21 DIAGNOSIS — M25572 Pain in left ankle and joints of left foot: Secondary | ICD-10-CM | POA: Diagnosis not present

## 2021-07-21 DIAGNOSIS — G4733 Obstructive sleep apnea (adult) (pediatric): Secondary | ICD-10-CM | POA: Insufficient documentation

## 2021-07-21 NOTE — Progress Notes (Signed)
[facility]  Reason for Consult: Transient left ankle pain following recent tick bite Referring Provider: Dr. Kyra Manges Cagle  Assessment: The chills and sweats that he developed shortly after the tick was discovered may have been tickborne illness, most likely [facility] spotted fever.  Early treatment of RMSF can promptly resolve all symptoms and abort development of the spots (rash).  I am not sure what caused his recent, transient of left ankle pain.  I told him that I would not assume that this is related to the tick bite.  I doubt that he has Lyme disease as it is not endemic in [place].  Since he has back to his normal baseline I do not feel that any further testing for tickborne illness is warranted and I do not think that he needs any further antibiotic treatment.  He is in agreement with that plan.  Plan: Follow-up here as needed  Patient Active Problem List   Diagnosis Date Noted   Type 1 diabetes mellitus ([place]) 07/21/2021   HTN (hypertension) 07/21/2021   Obstructive sleep apnea 07/21/2021    Patient's Medications  New Prescriptions   No medications on file  Previous Medications   DIFLUPREDNATE 0.05 % EMUL    Apply to eye.   DORZOLAMIDE-TIMOLOL (COSOPT) 22.3-6.8 MG/ML OPHTHALMIC SOLUTION    SMARTSIG:In Eye(s)   INSULIN ASPART (NOVOLOG) 100 UNIT/ML INJECTION    Inject 12-20 Units into the skin 3 (three) times daily before meals.   MELATONIN PO    Take 6 mg by mouth at bedtime.   SILDENAFIL (VIAGRA) 25 MG TABLET    Take 25 mg by mouth daily as needed for erectile dysfunction.   TELMISARTAN (MICARDIS) 40 MG TABLET    Take 40 mg by mouth daily.   TRESIBA FLEXTOUCH 100 UNIT/ML FLEXTOUCH PEN    Inject 15-20 Units into the skin every morning.  Modified Medications   No medications on file  Discontinued Medications   No medications on file    HPI: Steven Hoffman is a 56 y.o. male with history of type 1 diabetes, hypertension and  obstructive sleep apnea who discovered an attached tick on his right posterior shoulder in mid May.  He estimates that the tick may have been attached for 48 to 72 hours.  It was not engorged.  Just before the tick was discovered he had been visiting his son during graduation from [employer].  He had had no recent travel outside of [place].  His wife carefully removed the tick.  That evening he developed chills and sweats.  He was evaluated in his PCPs office the following day.  He was started on empiric doxycycline 100 mg twice daily for 10 days.  He felt much better and had no more chills or sweats.  He completed the doxycycline therapy about 3 weeks ago.  Last Thursday, 07/16/2021, he had acute onset of severe left ankle pain.  He denies any injury to his ankle.  He did not note any unusual swelling, warmth or redness associated with the pain.  He took meloxicam and that pain resolved over the next 2 to 3 days.  He has not had any recurrence.  About 30 years ago he had severe polyarthralgias that eventually resolved with aspirin therapy.  Review of Systems: Review of Systems  Constitutional:  Positive for chills and diaphoresis.  Musculoskeletal:  Positive for joint pain.  Skin:  Negative for rash.  Past Medical History:  Diagnosis Date   Diabetes mellitus without complication (HCC)    HLD (hyperlipidemia)    Hypertension     Social History   Tobacco Use   Smoking status: Never   Smokeless tobacco: Never  Substance Use Topics   Alcohol use: Yes    Alcohol/week: 7.0 - 14.0 standard drinks of alcohol    Types: 7 - 14 Standard drinks or equivalent per week   Drug use: Never    Family History  Problem Relation Age of Onset   Hypothyroidism Mother    Nephrolithiasis Father    Prostate cancer Father    Heart disease Father    Allergies  Allergen Reactions   Altace [Ramipril]     OBJECTIVE: Vitals:   07/21/21 1538  BP: 123/82  Pulse: 66  Temp: 97.6 F  (36.4 C)  TempSrc: Oral  Weight: 193 lb (87.5 kg)  Height: '5\' 10"'$  (1.778 m)   Body mass index is 27.69 kg/m.   Physical Exam Constitutional:      Comments: He is in good spirits.  Musculoskeletal:        General: No swelling, tenderness or deformity.     Comments: There is no unusual swelling, redness or warmth of his left ankle.  He has good range of motion without any pain.  Skin:    Findings: No rash.     Comments: He has a 10 mm red nodule at the site of the tick attachment on his right posterior shoulder.  Neurological:     Gait: Gait normal.  Psychiatric:        Mood and Affect: Mood normal.     Microbiology: No results found for this or any previous visit (from the past 240 hour(s)).  Michel Bickers, MD [facility] [phone] pager   [phone] cell 07/21/2021, 4:13 PM

## 2021-08-21 DIAGNOSIS — H59811 Chorioretinal scars after surgery for detachment, right eye: Secondary | ICD-10-CM | POA: Diagnosis not present

## 2021-08-21 DIAGNOSIS — H43391 Other vitreous opacities, right eye: Secondary | ICD-10-CM | POA: Diagnosis not present

## 2021-09-04 DIAGNOSIS — H2513 Age-related nuclear cataract, bilateral: Secondary | ICD-10-CM | POA: Diagnosis not present

## 2021-09-04 DIAGNOSIS — H35351 Cystoid macular degeneration, right eye: Secondary | ICD-10-CM | POA: Diagnosis not present

## 2021-09-04 DIAGNOSIS — H59811 Chorioretinal scars after surgery for detachment, right eye: Secondary | ICD-10-CM | POA: Diagnosis not present

## 2021-09-04 DIAGNOSIS — T85398A Other mechanical complication of other ocular prosthetic devices, implants and grafts, initial encounter: Secondary | ICD-10-CM | POA: Diagnosis not present

## 2021-09-08 DIAGNOSIS — H25013 Cortical age-related cataract, bilateral: Secondary | ICD-10-CM | POA: Diagnosis not present

## 2021-09-08 DIAGNOSIS — H2511 Age-related nuclear cataract, right eye: Secondary | ICD-10-CM | POA: Diagnosis not present

## 2021-09-08 DIAGNOSIS — H2513 Age-related nuclear cataract, bilateral: Secondary | ICD-10-CM | POA: Diagnosis not present

## 2021-09-08 DIAGNOSIS — H35413 Lattice degeneration of retina, bilateral: Secondary | ICD-10-CM | POA: Diagnosis not present

## 2021-09-08 DIAGNOSIS — H40013 Open angle with borderline findings, low risk, bilateral: Secondary | ICD-10-CM | POA: Diagnosis not present

## 2021-09-08 DIAGNOSIS — H35372 Puckering of macula, left eye: Secondary | ICD-10-CM | POA: Diagnosis not present

## 2021-09-08 DIAGNOSIS — H3581 Retinal edema: Secondary | ICD-10-CM | POA: Diagnosis not present

## 2021-09-09 DIAGNOSIS — E109 Type 1 diabetes mellitus without complications: Secondary | ICD-10-CM | POA: Diagnosis not present

## 2021-09-22 ENCOUNTER — Telehealth: Payer: Self-pay | Admitting: Family Medicine

## 2021-09-22 NOTE — Telephone Encounter (Signed)
Paul from [facility] called stating that he is needing an Rx for a pt to be able to buy a [employer] from them. Please fax Rx to: Attention Marijean Niemann [phone] should go on Cover Fax# [phone]

## 2021-09-22 NOTE — Telephone Encounter (Signed)
Pt has upcoming appt on 10/06/2021 will address then.

## 2021-09-28 DIAGNOSIS — E109 Type 1 diabetes mellitus without complications: Secondary | ICD-10-CM | POA: Diagnosis not present

## 2021-10-05 DIAGNOSIS — I10 Essential (primary) hypertension: Secondary | ICD-10-CM | POA: Diagnosis not present

## 2021-10-05 DIAGNOSIS — E109 Type 1 diabetes mellitus without complications: Secondary | ICD-10-CM | POA: Diagnosis not present

## 2021-10-06 ENCOUNTER — Ambulatory Visit: Payer: BC Managed Care – PPO | Admitting: Family Medicine

## 2021-10-26 ENCOUNTER — Encounter: Payer: Self-pay | Admitting: Neurology

## 2021-10-26 ENCOUNTER — Ambulatory Visit (INDEPENDENT_AMBULATORY_CARE_PROVIDER_SITE_OTHER): Payer: BC Managed Care – PPO | Admitting: Neurology

## 2021-10-26 VITALS — BP 147/83 | HR 56 | Ht 70.0 in | Wt 193.8 lb

## 2021-10-26 DIAGNOSIS — D582 Other hemoglobinopathies: Secondary | ICD-10-CM

## 2021-10-26 DIAGNOSIS — Z9989 Dependence on other enabling machines and devices: Secondary | ICD-10-CM

## 2021-10-26 DIAGNOSIS — G4733 Obstructive sleep apnea (adult) (pediatric): Secondary | ICD-10-CM

## 2021-10-26 DIAGNOSIS — E1039 Type 1 diabetes mellitus with other diabetic ophthalmic complication: Secondary | ICD-10-CM

## 2021-10-26 NOTE — Patient Instructions (Addendum)
KEEPING IT CLEAN: CPAP HYGIENE PROPER UPKEEP OF YOUR CPAP MACHINE CAN HELP ENSURE THE DEVICE FUNCTIONS PROPERLY CPAP CLEANING INSTRUCTIONS Along with proper CPAP cleaning it is recommended that you replace your mask, tubing and filters once very 3 months and more frequently if you are sick.   DAILY CLEANING Do not use moisturizing soaps, bleach, scented oils, chlorine, or alcohol-based solutions to clean your supplies. These solutions may cause irritation to your skin and lungs and may reduce the life of your products. Dawn [employer] or Comparable works best for daily cleaning.  **If you've been sick, it's smart to wash your mask, tubing, humidifier and filter daily until your cold, flu or virus symptoms are gone. That can help reduce the amount of time you spend under the weather.  Before using your mask -wash your face daily with soap and water to remove excess facial oils. Wipe down your mask (including areas that come in contact with your skin) using a damp towel with soap and warm water. This will remove any oils, dead skin cells, and sweat on the mask that can affect the quality of the seal. Gently rinse with a clean towel and let the mask air-dry out of direct sunlight. You can also use unscented baby wipes or pre-moistened towels designed specifically for cleaning CPAP masks, which are available on-line. DO NOT USE CLOROX OR DISINFECTING WIPES. If your unit has a humidifier, empty any leftover water instead of letting in sit in the unit all day. Refill the humidifier with clean, distilled water right before bedtime for optimal use WEEKLY (OR MORE FREQUENT) CLEANING Your mask and tubing need a full bath at least once a week to keep it free of dust, bacteria, and germs. (During COVID-19 or any other flu/virus we recommend more frequent cleaning) Clean the CPAP tubing, nasal mask, and headgear in a bathroom sink filled with warm water and a few drops of ammonia-free, mild dish detergent. Avoid  using stronger cleaning products, as they may damage the mask or leave harmful residue. Swirl all parts around for about five minutes, rinse well and let air dry during the day. Hang the tubing over the shower rod, on a towel rack or in the laundry room to ensure all the water drips out. The mask and headgear can be air-dried on a towel or hung on a hook or hanger. You should also wipe down your CPAP machine with a damp cloth. Ensure the unit is unplugged. The towel shouldn't be too damp or wet, as water could get into the machine. Clean the filter by removing it and rinsing it in warm tap water. Run it under the water and squeeze to make sure there is no dust. Then blot down the filter with a towel. Do not wash your machine's white filter, if one is present--those are disposable and should be replaced every two weeks. If you are recovering from being sick, we recommend changing the filter sooner. If your CPAP has a humidifier, that also needs to be cleaned weekly. Empty any remaining water and then wash the water chamber in the sink with warm soapy water. Rinse well and drain out as much of the water as possible. Let the chamber air-dry before placing it back into the CPAP unit. Every other week you should disinfect the humidifier. Do that by soaking it in a solution of one-part vinegar to five parts water for 30 minutes, thoroughly rinsing and then placing in your dishwasher's top rack for washing. And keep  it clean by using only distilled water to prevent mineral deposits that can build up and cause damage to your machine. IMPORTANT TIPS Make caring for your CPAP equipment part of your morning routine. Keep machine and accessories out of direct sunlight to avoid damaging them. Never use bleach to clean accessories. Place machine on a level surface and away from curtains that may interfere with the air intake. Keep track of when you should order replacement parts for your mask and accessories so that  you always get the most out of your CPAP. You can also sign up for [employer] by contacting our DME department at [email or web address] **The following are examples of soap that may be used: [employer], [place] soap (plain).  With a little upkeep, your CPAP can continue to help you breathe better for a long time. Just a few minutes a day can help keep your CPAP running efficiently for years to come.  If you have a CPAP, but are struggling with compliance.

## 2021-10-26 NOTE — Progress Notes (Signed)
SLEEP MEDICINE CLINIC    Provider:  Larey Seat, MD  Primary Care Physician:  Prince Solian, MD [place] [address]     Referring Provider: Prince Solian, [address] [place] [address],  [place] [address]          Chief Complaint according to patient   Patient presents with:     New Patient (Initial Visit)     Pt alone, rm 11. Presented originally due to having increase Hgb levels. He has seen a hematologist and all test completed through them were negative.  They recommended  to eval for OSA.Pt state nothing has change since the last visit. Pt state he gets about six hours at night.       HISTORY OF PRESENT ILLNESS:  Steven Hoffman is a 36 - year -old  Caucasian male patient who is seen here upon a RV on 10/26/2021 , Following his 2022 home sleep test which revealed an apnea hypopnea index of 22/h, this AHI exacerbated in rem sleep and in supine sleep.  The patient has been a compliant CPAP user since.  Last visit was on 10/06/2020 with Debbora Presto, NP.  The patient just purchased a travel friendly mini air machine. The patient had an interesting interval history he had a retinal tear about 3 ocular surgeries this year he was not able to use his CPAP right after surgery. Medication has not changed, with the exception that he is now on a insulin pump. He is diabetic since age 59, type 1. Has a diabetic 2 year old son, dx at age 68.     He also purchased the air mini machine = 95th percentile pressure on his autotitrator was 6.9 cm water so his air mini should be set to 7 cm and he added 14 days of use on the air mini to his 64 days on the regular autotitration CPAP with would make for a 78 out of 90-day compliance. His AutoSet has a generous pressure range between 5 and 18 cmH2O was 2 cm EPR, his residual AHI is 2.6/h this is excellent he does have more central than obstructive residual apneas which do not warrant change of therapy.   The 95th percentile air  leak was 24.8 L a minute so it may be time to change mask or headgear again if the leak gets too strong.  Air leak also can irritate the eye.  Uses an airfit N 20 ResMed nasal mask.   The Epworth Sleepiness Scale was endorsed at 4 out of 24 points in the fatigue severity score at 11 out of 63 points.    He is not a candidate for dental device or Inspire.          Consult: 04-08-2020, From Dr. Dagmar Hait, MD, PhD, concerned about elevated Hgb levels.  The patient reports that he has never been as smoker, he does not have a history of asthma or exercise-induced respiratory problems.  He does not travel to high altitudes and has not done so recently.  The elevated hemoglobin has thus far been unexplained but a hematological reason with an genetic means has not been found.  One question is if the patient may have silent hypoxemia at night which can be related to an undiagnosed sleep apnea.  Mr. Gadd is aware of his snoring but he has not been told that he frequently quits breathing during sleep.  He does not report morning headaches.  Chief concern according to patient : "  no answer found yet"    Steven Hoffman  has a past medical history of Diabetes mellitus type 1 at age 64 and his son was diagnosed at age 8, without complication ([place]), HLD (hyperlipidemia), and Hypertension. Not on insulin pump.     Sleep relevant medical history: Nocturia, 1-2 , dreams frequently on melatonin,. Family medical /sleep history: NO other family member on CPAP with OSA, insomnia, sleep walkers.    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  Pets are present, one dog. Tobacco use- never .  ETOH use - wine-regular with dinner. Caffeine intake in form of Coffee( 2 cups in AM  ) Soda( /) Tea (/) or energy drinks. Regular exercise in form  of walking, yoga, peleton, .     Sleep habits are as follows: The patient's dinner time is between 6.30 PM. The patient goes to bed at 9.30 PM and continues to sleep for 7 hours, wakes for 1-2 bathroom breaks, the first time at 3 AM.   The preferred sleep position is  Supine , with the support of 1 pillow.  Dreams are reportedly frequent/vivid- since melatonin.  6-7  AM is the usual rise time. The patient wakes up spontaneously. He reports feeling refreshed or restored in AM, with symptoms such as naps are not taken.    Review of Systems: Out of a complete 14 system review, the patient complains of only the following symptoms, and all other reviewed systems are negative.:  snoring, 2 times nocturia, no RLS.    How likely are you to doze in the following situations: 0 = not likely, 1 = slight chance, 2 = moderate chance, 3 = high chance   Sitting and Reading? Watching Television? Sitting inactive in a public place (theater or meeting)? As a passenger in a car for an hour without a break? Lying down in the afternoon when circumstances permit? Sitting and talking to someone? Sitting quietly after lunch without alcohol? In a car, while stopped for a few minutes in traffic?   Total = 2/ 24 points pre CPAP   FSS endorsed at 14 / 63 points. Pre CPAP  Social History   Socioeconomic History   Marital status: Married    Spouse name: Not on file   Number of children: Not on file   Years of education: Not on file   Highest education level: Not on file  Occupational History   Not on file  Tobacco Use   Smoking status: Never   Smokeless tobacco: Never  Substance and Sexual Activity   Alcohol use: Yes    Alcohol/week: 7.0 - 14.0 standard drinks of alcohol    Types: 7 - 14 Standard drinks or equivalent per week   Drug use: Never   Sexual activity: Not on file  Other Topics Concern   Not on file  Social History Narrative   Not on file   Social Determinants of Health   Financial  Resource Strain: Not on file  Food Insecurity: Not on file  Transportation Needs: Not on file  Physical Activity: Not on file  Stress: Not on file  Social Connections: Not on file    Family History  Problem Relation Age of Onset   Hypothyroidism Mother    Nephrolithiasis Father    Prostate cancer  Father    Heart disease Father     Past Medical History:  Diagnosis Date   Diabetes mellitus without complication ([facility])    HLD (hyperlipidemia)    Hypertension      Current Outpatient Medications on File Prior to Visit  Medication Sig Dispense Refill   Difluprednate 0.05 % EMUL Apply to eye.     dorzolamide-timolol (COSOPT) 22.3-6.8 MG/ML ophthalmic solution SMARTSIG:In Eye(s)     insulin aspart (NOVOLOG) 100 UNIT/ML injection Inject 12-20 Units into the skin 3 (three) times daily before meals.     MELATONIN PO Take 6 mg by mouth at bedtime.     sildenafil (VIAGRA) 25 MG tablet Take 25 mg by mouth daily as needed for erectile dysfunction.     telmisartan (MICARDIS) 40 MG tablet Take 40 mg by mouth daily.     TRESIBA FLEXTOUCH 100 UNIT/ML FlexTouch Pen Inject 15-20 Units into the skin every morning.     No current facility-administered medications on file prior to visit.    No Active Allergies   Physical exam:  Today's Vitals   10/26/21 0807  BP: (!) 147/83  Pulse: (!) 56  Weight: 193 lb 12.8 oz (87.9 kg)  Height: '5\' 10"'$  (1.778 m)   Body mass index is 27.81 kg/m.   Wt Readings from Last 3 Encounters:  10/26/21 193 lb 12.8 oz (87.9 kg)  07/21/21 193 lb (87.5 kg)  10/06/20 190 lb 12.8 oz (86.5 kg)     Ht Readings from Last 3 Encounters:  10/26/21 '5\' 10"'$  (1.778 m)  07/21/21 '5\' 10"'$  (1.778 m)  10/06/20 '5\' 10"'$  (1.778 m)      General: The patient is awake, alert and appears not in acute distress.  The patient is well groomed. Head: Normocephalic, atraumatic.  Neck is supple. Mallampati 1  ,  neck circumference: 17 inches . Nasal airflow patent.   Retrognathia is  not seen.  Dental status:  Cardiovascular:  Regular rate and cardiac rhythm by pulse,  without distended neck veins. Respiratory: Lungs are clear to auscultation.  Skin:  Without evidence of ankle edema, or rash. Trunk: The patient's posture is erect.   Neurologic exam : The patient is awake and alert, oriented to place and time.   Memory subjective described as intact.  Attention span & concentration ability appears normal.  Speech is fluent without dysarthria, dysphonia or aphasia.  Mood and affect are appropriate.   Cranial nerves: no loss of smell or taste reported  Pupils are equal and briskly reactive to light. Funduscopic exam deferred.   Extraocular movements in vertical and horizontal planes were intact and without nystagmus. No Diplopia. Visual fields by finger perimetry are intact. Hearing was intact to soft voice and finger rubbing.    Facial sensation intact to fine touch.  Facial motor strength is symmetric and tongue and uvula move midline.  Neck ROM : rotation, tilt and flexion extension were normal for age and shoulder shrug was symmetrical.    Motor exam:  Symmetric bulk, tone and ROM.   Normal tone without cog wheeling, symmetric grip strength .   Sensory:   deferred.        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 Labs indicate a higher Hgb for at least 2 years , no recent testosteron use. Moderate apnea was found, he is compliant enough with CPAP and travel CPAP.    My Plan is to proceed with:  1)  continue CPAP.  Take extended release melatonin,3 mg , EX formula.     I would like to thank Prince Solian, MD  and Prince Solian, [place],  [place] [address] for allowing me to meet with and to take care of this pleasant patient.    Electronically signed by: Larey Seat, MD 10/26/2021 8:32 AM  [facility] [employer] Board certified by The [employer] of Sleep Medicine and Diplomate of the [employer] of Sleep Medicine. Board certified In Neurology through the [place], Fellow of the [employer] of Neurology. Medical Director of [employer].

## 2021-10-27 LAB — COMPREHENSIVE METABOLIC PANEL
ALT: 32 IU/L (ref 0–44)
AST: 39 IU/L (ref 0–40)
Albumin/Globulin Ratio: 1.8 (ref 1.2–2.2)
Albumin: 4.3 g/dL (ref 3.8–4.9)
Alkaline Phosphatase: 89 IU/L (ref 44–121)
BUN/Creatinine Ratio: 12 (ref 9–20)
BUN: 13 mg/dL (ref 6–24)
Bilirubin Total: 1.5 mg/dL — ABNORMAL HIGH (ref 0.0–1.2)
CO2: 26 mmol/L (ref 20–29)
Calcium: 9.6 mg/dL (ref 8.7–10.2)
Chloride: 99 mmol/L (ref 96–106)
Creatinine, Ser: 1.1 mg/dL (ref 0.76–1.27)
Globulin, Total: 2.4 g/dL (ref 1.5–4.5)
Glucose: 144 mg/dL — ABNORMAL HIGH (ref 70–99)
Potassium: 4.7 mmol/L (ref 3.5–5.2)
Sodium: 138 mmol/L (ref 134–144)
Total Protein: 6.7 g/dL (ref 6.0–8.5)
eGFR: 78 mL/min/{1.73_m2} (ref >59)

## 2021-10-27 LAB — CBC WITH DIFF/PLATELET
Basophils Absolute: 0.1 10*3/uL (ref 0.0–0.2)
Basos: 1 %
EOS (ABSOLUTE): 0.1 10*3/uL (ref 0.0–0.4)
Eos: 2 %
Hematocrit: 46.5 % (ref 37.5–51.0)
Hemoglobin: 16.4 g/dL (ref 13.0–17.7)
Immature Grans (Abs): 0 10*3/uL (ref 0.0–0.1)
Immature Granulocytes: 1 %
Lymphocytes Absolute: 1.3 10*3/uL (ref 0.7–3.1)
Lymphs: 29 %
MCH: 32.5 pg (ref 26.6–33.0)
MCHC: 35.3 g/dL (ref 31.5–35.7)
MCV: 92 fL (ref 79–97)
Monocytes Absolute: 0.7 10*3/uL (ref 0.1–0.9)
Monocytes: 17 %
Neutrophils Absolute: 2.2 10*3/uL (ref 1.4–7.0)
Neutrophils: 50 %
Platelets: 203 10*3/uL (ref 150–450)
RBC: 5.04 x10E6/uL (ref 4.14–5.80)
RDW: 12 % (ref 11.6–15.4)
WBC: 4.3 10*3/uL (ref 3.4–10.8)

## 2021-10-28 ENCOUNTER — Telehealth: Payer: Self-pay | Admitting: *Deleted

## 2021-10-28 NOTE — Telephone Encounter (Signed)
-----   Message from Larey Seat, MD sent at 10/27/2021  6:36 PM EDT ----- Elevated bilirubin 1.5 , normal CBC results.

## 2021-10-28 NOTE — Telephone Encounter (Signed)
Called and spoke w/ pt about results. He saw on mychart already and will f/u w/ PCP. Report sent to PCP via epic.

## 2021-11-17 DIAGNOSIS — G4733 Obstructive sleep apnea (adult) (pediatric): Secondary | ICD-10-CM | POA: Diagnosis not present

## 2021-12-07 ENCOUNTER — Encounter: Payer: Self-pay | Admitting: Neurology

## 2021-12-07 DIAGNOSIS — H43391 Other vitreous opacities, right eye: Secondary | ICD-10-CM | POA: Diagnosis not present

## 2021-12-07 DIAGNOSIS — T85398A Other mechanical complication of other ocular prosthetic devices, implants and grafts, initial encounter: Secondary | ICD-10-CM | POA: Diagnosis not present

## 2021-12-07 DIAGNOSIS — H2511 Age-related nuclear cataract, right eye: Secondary | ICD-10-CM | POA: Diagnosis not present

## 2021-12-07 DIAGNOSIS — Z48810 Encounter for surgical aftercare following surgery on the sense organs: Secondary | ICD-10-CM | POA: Diagnosis not present

## 2021-12-07 DIAGNOSIS — Z8669 Personal history of other diseases of the nervous system and sense organs: Secondary | ICD-10-CM | POA: Diagnosis not present

## 2021-12-07 DIAGNOSIS — H3581 Retinal edema: Secondary | ICD-10-CM | POA: Diagnosis not present

## 2021-12-15 DIAGNOSIS — H35372 Puckering of macula, left eye: Secondary | ICD-10-CM | POA: Diagnosis not present

## 2021-12-15 DIAGNOSIS — H59811 Chorioretinal scars after surgery for detachment, right eye: Secondary | ICD-10-CM | POA: Diagnosis not present

## 2021-12-18 DIAGNOSIS — G4733 Obstructive sleep apnea (adult) (pediatric): Secondary | ICD-10-CM | POA: Diagnosis not present

## 2021-12-21 ENCOUNTER — Ambulatory Visit (INDEPENDENT_AMBULATORY_CARE_PROVIDER_SITE_OTHER): Payer: BC Managed Care – PPO | Admitting: Podiatry

## 2021-12-21 ENCOUNTER — Ambulatory Visit: Payer: BC Managed Care – PPO

## 2021-12-21 DIAGNOSIS — M79674 Pain in right toe(s): Secondary | ICD-10-CM

## 2021-12-21 DIAGNOSIS — M79671 Pain in right foot: Secondary | ICD-10-CM

## 2021-12-21 DIAGNOSIS — L600 Ingrowing nail: Secondary | ICD-10-CM | POA: Diagnosis not present

## 2021-12-21 DIAGNOSIS — M79675 Pain in left toe(s): Secondary | ICD-10-CM

## 2021-12-21 DIAGNOSIS — M79672 Pain in left foot: Secondary | ICD-10-CM

## 2021-12-21 NOTE — Patient Instructions (Signed)
Soak Instructions    THE DAY AFTER THE PROCEDURE  Place 1/4 cup of epsom salts in a quart of warm tap water.  Submerge your foot or feet with outer bandage intact for the initial soak; this will allow the bandage to become moist and wet for easy lift off.  Once you remove your bandage, continue to soak in the solution for 20 minutes.  This soak should be done twice a day.  Next, remove your foot or feet from solution, blot dry the affected area and cover.  You may use a band aid large enough to cover the area or use gauze and tape.  Apply other medications to the area as directed by the doctor such as polysporin neosporin.  IF YOUR SKIN BECOMES IRRITATED WHILE USING THESE INSTRUCTIONS, IT IS OKAY TO SWITCH TO  WHITE VINEGAR AND WATER. Or you may use antibacterial soap and water to keep the toe clean  Monitor for any signs/symptoms of infection. Call the office immediately if any occur or go directly to the emergency room. Call with any questions/concerns. Ingrown Toenail  An ingrown toenail occurs when the corner or sides of a toenail grow into the surrounding skin. This causes discomfort and pain. The big toe is most commonly affected, but any of the toes can be affected. If an ingrown toenail is not treated, it can become infected. What are the causes? This condition may be caused by: Wearing shoes that are too small or tight. An injury, such as stubbing your toe or having your toe stepped on. Improper cutting or care of your toenails. Having nail or foot abnormalities that were present from birth (congenital abnormalities), such as having a nail that is too big for your toe. What increases the risk? The following factors may make you more likely to develop ingrown toenails: Age. Nails tend to get thicker with age, so ingrown nails are more common among older people. Cutting your toenails incorrectly, such as cutting them very short or cutting them unevenly. An ingrown toenail is more  likely to get infected if you have: Diabetes. Blood flow (circulation) problems. What are the signs or symptoms? Symptoms of an ingrown toenail may include: Pain, soreness, or tenderness. Redness. Swelling. Hardening of the skin that surrounds the toenail. Signs that an ingrown toenail may be infected include: Fluid or pus. Symptoms that get worse. How is this diagnosed? Ingrown toenails may be diagnosed based on: Your symptoms and medical history. A physical exam. Labs or tests. If you have fluid or blood coming from your toenail, a sample may be collected to test for the specific type of bacteria that is causing the infection. How is this treated? Treatment depends on the severity of your symptoms. You may be able to care for your toenail at home. If you have an infection, you may be prescribed antibiotic medicines. If you have fluid or pus draining from your toenail, your health care provider may drain it. If you have trouble walking, you may be given crutches to use. If you have a severe or infected ingrown toenail, you may need a procedure to remove part or all of the nail. Follow these instructions at home: [place] your wound every day for signs of infection, or as often as told by your health care provider. Check for: More redness, swelling, or pain. More fluid or blood. Warmth. Pus or a bad smell. Do not pick at your toenail or try to remove it yourself. Soak your foot  in warm, soapy water. Do this for 20 minutes, 3 times a day, or as often as told by your health care provider. This helps to keep your toe clean and your skin soft. Wear shoes that fit well and are not too tight. Your health care provider may recommend that you wear open-toed shoes while you heal. Trim your toenails regularly and carefully. Cut your toenails straight across to prevent injury to the skin at the corners of the toenail. Do not cut your nails in a curved shape. Keep your feet clean and  dry to help prevent infection. General instructions Take over-the-counter and prescription medicines only as told by your health care provider. If you were prescribed an antibiotic, take it as told by your health care provider. Do not stop taking the antibiotic even if you start to feel better. If your health care provider told you to use crutches to help you move around, use them as instructed. Return to your normal activities as told by your health care provider. Ask your health care provider what activities are safe for you. Keep all follow-up visits. This is important. Contact a health care provider if: You have more redness, swelling, pain, or other symptoms that do not improve with treatment. You have fluid, blood, or pus coming from your toenail. You have a red streak on your skin that starts at your foot and spreads up your leg. You have a fever. Summary An ingrown toenail occurs when the corner or sides of a toenail grow into the surrounding skin. This causes discomfort and pain. The big toe is most commonly affected, but any of the toes can be affected. If an ingrown toenail is not treated, it can become infected. Fluid or pus draining from your toenail is a sign of infection. Your health care provider may need to drain it. You may be given antibiotics to treat the infection. Trimming your toenails regularly and properly can help you prevent an ingrown toenail. This information is not intended to replace advice given to you by your health care provider. Make sure you discuss any questions you have with your health care provider. Document Revised: 05/27/2020 Document Reviewed: 05/27/2020 Elsevier Patient Education  [place].

## 2021-12-22 DIAGNOSIS — H35351 Cystoid macular degeneration, right eye: Secondary | ICD-10-CM | POA: Diagnosis not present

## 2021-12-22 DIAGNOSIS — H318 Other specified disorders of choroid: Secondary | ICD-10-CM | POA: Diagnosis not present

## 2021-12-22 DIAGNOSIS — H35372 Puckering of macula, left eye: Secondary | ICD-10-CM | POA: Diagnosis not present

## 2021-12-22 DIAGNOSIS — H59811 Chorioretinal scars after surgery for detachment, right eye: Secondary | ICD-10-CM | POA: Diagnosis not present

## 2021-12-22 DIAGNOSIS — H43391 Other vitreous opacities, right eye: Secondary | ICD-10-CM | POA: Diagnosis not present

## 2021-12-22 DIAGNOSIS — T85398S Other mechanical complication of other ocular prosthetic devices, implants and grafts, sequela: Secondary | ICD-10-CM | POA: Diagnosis not present

## 2021-12-23 NOTE — Progress Notes (Signed)
Subjective:   Patient ID: Steven Hoffman, male   DOB: 56 y.o.   MRN: [id number]   HPI Chief Complaint  Patient presents with   Nail Problem    Patient came in today for for hallux nail pain, started 6 mths ago, patient is only having pain when he touches the toes and at night when the cover lay on the foot, Diabetic A1c- 6.1 BG- 21    56 year old male presents the office today with concerns of discomfort along the medial aspect of bilateral hallux toes along the medial toenail distally was about 6 months ago.  States that it hurts with pressure.  Denies any swelling redness or any drainage.  He is not sure of how this started.    Review of Systems  All other systems reviewed and are negative.  Past Medical History:  Diagnosis Date   Diabetes mellitus without complication ([facility])    HLD (hyperlipidemia)    Hypertension     Past Surgical History:  Procedure Laterality Date   PARTIAL NEPHRECTOMY Right 1980     Current Outpatient Medications:    Difluprednate 0.05 % EMUL, Apply to eye., Disp: , Rfl:    dorzolamide-timolol (COSOPT) 22.3-6.8 MG/ML ophthalmic solution, SMARTSIG:In Eye(s), Disp: , Rfl:    insulin aspart (NOVOLOG) 100 UNIT/ML injection, Inject 12-20 Units into the skin 3 (three) times daily before meals., Disp: , Rfl:    MELATONIN PO, Take 6 mg by mouth at bedtime., Disp: , Rfl:    sildenafil (VIAGRA) 25 MG tablet, Take 25 mg by mouth daily as needed for erectile dysfunction., Disp: , Rfl:    telmisartan (MICARDIS) 40 MG tablet, Take 40 mg by mouth daily., Disp: , Rfl:    TRESIBA FLEXTOUCH 100 UNIT/ML FlexTouch Pen, Inject 15-20 Units into the skin every morning., Disp: , Rfl:   No Active Allergies        Objective:  Physical Exam  General: AAO x3, NAD  Dermatological: Mild incurvation of the medial aspect of the hallux toenails bilaterally along the distal portion with mild tenderness palpation there is no edema, erythema, drainage or pus or signs of infection.   Upon debridement there was some dried skin buildup along the nail border as well.  Vascular: Dorsalis Pedis artery and Posterior Tibial artery pedal pulses are 2/4 bilateral with immedate capillary fill time.  There is no pain with calf compression, swelling, warmth, erythema.   Neruologic: Grossly intact via light touch bilateral.  Musculoskeletal: Tenderness palpation along distal medial nail border bilaterally but no other areas of discomfort.  Gait: Unassisted, Nonantalgic.       Assessment:   Ingrown toenail without signs of infection     Plan:  -Treatment options discussed including all alternatives, risks, and complications -Etiology of symptoms were discussed -We discussed partial avulsion versus conservative care.  Today she will be debrided the corners of the nail without any complications or bleeding.  Discussed Epsom salt soaks daily as well as antibiotic ointment dressing changes.  If symptoms continue or worsen will proceed with partial nail avulsions with chemical matricectomy.  He is going to know if there is no improving or any changes.  Trula Slade DPM

## 2021-12-24 DIAGNOSIS — E119 Type 2 diabetes mellitus without complications: Secondary | ICD-10-CM | POA: Diagnosis not present

## 2022-01-04 ENCOUNTER — Other Ambulatory Visit: Payer: Self-pay | Admitting: Podiatry

## 2022-01-04 DIAGNOSIS — L600 Ingrowing nail: Secondary | ICD-10-CM

## 2022-01-04 DIAGNOSIS — M79674 Pain in right toe(s): Secondary | ICD-10-CM

## 2022-01-04 DIAGNOSIS — M79671 Pain in right foot: Secondary | ICD-10-CM

## 2022-01-17 DIAGNOSIS — G4733 Obstructive sleep apnea (adult) (pediatric): Secondary | ICD-10-CM | POA: Diagnosis not present

## 2022-02-16 DIAGNOSIS — H35372 Puckering of macula, left eye: Secondary | ICD-10-CM | POA: Diagnosis not present

## 2022-03-09 DIAGNOSIS — H35372 Puckering of macula, left eye: Secondary | ICD-10-CM | POA: Diagnosis not present

## 2022-03-09 DIAGNOSIS — H35351 Cystoid macular degeneration, right eye: Secondary | ICD-10-CM | POA: Diagnosis not present

## 2022-03-09 DIAGNOSIS — H30041 Focal chorioretinal inflammation, macular or paramacular, right eye: Secondary | ICD-10-CM | POA: Diagnosis not present

## 2022-03-09 DIAGNOSIS — H40012 Open angle with borderline findings, low risk, left eye: Secondary | ICD-10-CM | POA: Diagnosis not present

## 2022-04-06 DIAGNOSIS — G4733 Obstructive sleep apnea (adult) (pediatric): Secondary | ICD-10-CM | POA: Diagnosis not present

## 2022-04-09 DIAGNOSIS — H40012 Open angle with borderline findings, low risk, left eye: Secondary | ICD-10-CM | POA: Diagnosis not present

## 2022-04-09 DIAGNOSIS — H30041 Focal chorioretinal inflammation, macular or paramacular, right eye: Secondary | ICD-10-CM | POA: Diagnosis not present

## 2022-04-09 DIAGNOSIS — H2512 Age-related nuclear cataract, left eye: Secondary | ICD-10-CM | POA: Diagnosis not present

## 2022-04-09 DIAGNOSIS — H35351 Cystoid macular degeneration, right eye: Secondary | ICD-10-CM | POA: Diagnosis not present

## 2022-04-15 DIAGNOSIS — I10 Essential (primary) hypertension: Secondary | ICD-10-CM | POA: Diagnosis not present

## 2022-04-15 DIAGNOSIS — R7989 Other specified abnormal findings of blood chemistry: Secondary | ICD-10-CM | POA: Diagnosis not present

## 2022-04-15 DIAGNOSIS — E785 Hyperlipidemia, unspecified: Secondary | ICD-10-CM | POA: Diagnosis not present

## 2022-04-15 DIAGNOSIS — Z125 Encounter for screening for malignant neoplasm of prostate: Secondary | ICD-10-CM | POA: Diagnosis not present

## 2022-04-15 DIAGNOSIS — E109 Type 1 diabetes mellitus without complications: Secondary | ICD-10-CM | POA: Diagnosis not present

## 2022-04-23 DIAGNOSIS — E785 Hyperlipidemia, unspecified: Secondary | ICD-10-CM | POA: Diagnosis not present

## 2022-04-23 DIAGNOSIS — Z0000 Encounter for general adult medical examination without abnormal findings: Secondary | ICD-10-CM | POA: Diagnosis not present

## 2022-04-23 DIAGNOSIS — R82998 Other abnormal findings in urine: Secondary | ICD-10-CM | POA: Diagnosis not present

## 2022-04-23 DIAGNOSIS — G4733 Obstructive sleep apnea (adult) (pediatric): Secondary | ICD-10-CM | POA: Diagnosis not present

## 2022-04-23 DIAGNOSIS — D751 Secondary polycythemia: Secondary | ICD-10-CM | POA: Diagnosis not present

## 2022-04-23 DIAGNOSIS — E109 Type 1 diabetes mellitus without complications: Secondary | ICD-10-CM | POA: Diagnosis not present

## 2022-04-23 DIAGNOSIS — I10 Essential (primary) hypertension: Secondary | ICD-10-CM | POA: Diagnosis not present

## 2022-04-26 ENCOUNTER — Other Ambulatory Visit: Payer: Self-pay | Admitting: Internal Medicine

## 2022-04-26 DIAGNOSIS — E785 Hyperlipidemia, unspecified: Secondary | ICD-10-CM

## 2022-05-05 DIAGNOSIS — G4733 Obstructive sleep apnea (adult) (pediatric): Secondary | ICD-10-CM | POA: Diagnosis not present

## 2022-05-06 DIAGNOSIS — H35372 Puckering of macula, left eye: Secondary | ICD-10-CM | POA: Diagnosis not present

## 2022-05-06 DIAGNOSIS — Z961 Presence of intraocular lens: Secondary | ICD-10-CM | POA: Diagnosis not present

## 2022-05-06 DIAGNOSIS — H25012 Cortical age-related cataract, left eye: Secondary | ICD-10-CM | POA: Diagnosis not present

## 2022-05-06 DIAGNOSIS — H35413 Lattice degeneration of retina, bilateral: Secondary | ICD-10-CM | POA: Diagnosis not present

## 2022-05-06 DIAGNOSIS — H2512 Age-related nuclear cataract, left eye: Secondary | ICD-10-CM | POA: Diagnosis not present

## 2022-05-18 DIAGNOSIS — H30041 Focal chorioretinal inflammation, macular or paramacular, right eye: Secondary | ICD-10-CM | POA: Diagnosis not present

## 2022-05-18 DIAGNOSIS — H35372 Puckering of macula, left eye: Secondary | ICD-10-CM | POA: Diagnosis not present

## 2022-05-18 DIAGNOSIS — H35351 Cystoid macular degeneration, right eye: Secondary | ICD-10-CM | POA: Diagnosis not present

## 2022-05-18 DIAGNOSIS — H2512 Age-related nuclear cataract, left eye: Secondary | ICD-10-CM | POA: Diagnosis not present

## 2022-05-25 ENCOUNTER — Ambulatory Visit: Payer: BC Managed Care – PPO | Admitting: Family Medicine

## 2022-06-01 ENCOUNTER — Ambulatory Visit
Admission: RE | Admit: 2022-06-01 | Discharge: 2022-06-01 | Disposition: A | Discharge status: Home / Self Care | Payer: BC Managed Care – PPO | Source: Ambulatory Visit | Attending: Sports Medicine | Admitting: Sports Medicine

## 2022-06-01 ENCOUNTER — Ambulatory Visit: Payer: BC Managed Care – PPO | Admitting: Sports Medicine

## 2022-06-01 VITALS — BP 132/84 | Ht 70.0 in | Wt 190.0 lb

## 2022-06-01 DIAGNOSIS — M47816 Spondylosis without myelopathy or radiculopathy, lumbar region: Secondary | ICD-10-CM | POA: Diagnosis not present

## 2022-06-01 DIAGNOSIS — M545 Low back pain, unspecified: Secondary | ICD-10-CM | POA: Diagnosis not present

## 2022-06-02 ENCOUNTER — Encounter: Payer: Self-pay | Admitting: Sports Medicine

## 2022-06-02 NOTE — Progress Notes (Signed)
   Subjective:    Patient ID: Steven Hoffman, male    DOB: 10-15-1965, 57 y.o.   MRN: [id number]  HPI chief complaint: Low back pain  Steven Hoffman is a very pleasant 57 year old male that presents today with 2 weeks of low back pain.  His pain began acutely as he was bending over to pick up his shoes.  Pain was diffuse across his low back at the time and associated with some spasm.  Pain has now localized more to the right lower back.  He notified his PCPs office and was placed on a short course of steroids which were not very beneficial.  In fact, he is a type I diabetic and the oral steroids caused temporary hyperglycemia.  He has also tried both meloxicam and a muscle relaxer which were not beneficial.  Ice and Biofreeze have been somewhat helpful.  His pain is present both sitting and standing but a little worse with sitting.  Pain does not radiate down his legs.  He denies numbness and tingling.  He has had similar episodes of back pain in the past.  He has never had any lumbar spine imaging.  Past medical history reviewed Medications reviewed Allergies reviewed    Review of Systems As above    Objective:   Physical Exam  Well-developed, fit appearing.  No acute distress  Lumbar spine: Limited lumbar flexion and extension secondary to pain.  He is able to reproduce his pain with sidebending to the left.  No significant tenderness to palpation along the lumbar midline.  There is some tenderness to palpation along the right paraspinal musculature.  No significant spasm is noted.  No focal strength deficit grossly of either lower extremity.  Reflexes are brisk and equal at the Achilles and patellar tendons.  X-rays of his lumbar spine including AP and lateral views show some spurring off the L2 and L3 vertebral bodies.  Mild degenerative disc disease.  Nothing acute.  Please note that the radiology report was pending at the time of this dictation.      Assessment & Plan:   Low back pain likely  secondary to lumbar strain Type 1 diabetes  I recommended physical therapy.  [facility]  His symptoms are slowly improving and I would expect that to continue.  If not, consider merits of further diagnostic imaging.  Follow-up for ongoing or recalcitrant issues.  This note was dictated using Dragon naturally speaking software and may contain errors in syntax, spelling, or content which have not been identified prior to signing this note.

## 2022-06-05 DIAGNOSIS — G4733 Obstructive sleep apnea (adult) (pediatric): Secondary | ICD-10-CM | POA: Diagnosis not present

## 2022-06-07 ENCOUNTER — Encounter: Payer: Self-pay | Admitting: Sports Medicine

## 2022-06-09 DIAGNOSIS — M545 Low back pain, unspecified: Secondary | ICD-10-CM | POA: Diagnosis not present

## 2022-06-16 DIAGNOSIS — M545 Low back pain, unspecified: Secondary | ICD-10-CM | POA: Diagnosis not present

## 2022-06-21 DIAGNOSIS — H2512 Age-related nuclear cataract, left eye: Secondary | ICD-10-CM | POA: Diagnosis not present

## 2022-06-21 DIAGNOSIS — H35412 Lattice degeneration of retina, left eye: Secondary | ICD-10-CM | POA: Diagnosis not present

## 2022-06-21 DIAGNOSIS — H33332 Multiple defects of retina without detachment, left eye: Secondary | ICD-10-CM | POA: Diagnosis not present

## 2022-06-21 DIAGNOSIS — H35372 Puckering of macula, left eye: Secondary | ICD-10-CM | POA: Diagnosis not present

## 2022-06-21 DIAGNOSIS — H2511 Age-related nuclear cataract, right eye: Secondary | ICD-10-CM | POA: Diagnosis not present

## 2022-06-22 DIAGNOSIS — H2512 Age-related nuclear cataract, left eye: Secondary | ICD-10-CM | POA: Diagnosis not present

## 2022-06-22 DIAGNOSIS — Z9889 Other specified postprocedural states: Secondary | ICD-10-CM | POA: Diagnosis not present

## 2022-06-22 DIAGNOSIS — H35372 Puckering of macula, left eye: Secondary | ICD-10-CM | POA: Diagnosis not present

## 2022-06-29 ENCOUNTER — Other Ambulatory Visit: Payer: BC Managed Care – PPO

## 2022-06-29 DIAGNOSIS — H35372 Puckering of macula, left eye: Secondary | ICD-10-CM | POA: Diagnosis not present

## 2022-06-29 DIAGNOSIS — Z9889 Other specified postprocedural states: Secondary | ICD-10-CM | POA: Diagnosis not present

## 2022-06-29 DIAGNOSIS — H2512 Age-related nuclear cataract, left eye: Secondary | ICD-10-CM | POA: Diagnosis not present

## 2022-06-30 DIAGNOSIS — M545 Low back pain, unspecified: Secondary | ICD-10-CM | POA: Diagnosis not present

## 2022-07-06 DIAGNOSIS — G4733 Obstructive sleep apnea (adult) (pediatric): Secondary | ICD-10-CM | POA: Diagnosis not present

## 2022-07-11 DIAGNOSIS — M545 Low back pain, unspecified: Secondary | ICD-10-CM | POA: Diagnosis not present

## 2022-07-13 DIAGNOSIS — H35372 Puckering of macula, left eye: Secondary | ICD-10-CM | POA: Diagnosis not present

## 2022-07-13 DIAGNOSIS — Z9889 Other specified postprocedural states: Secondary | ICD-10-CM | POA: Diagnosis not present

## 2022-07-13 DIAGNOSIS — G4733 Obstructive sleep apnea (adult) (pediatric): Secondary | ICD-10-CM | POA: Diagnosis not present

## 2022-07-14 ENCOUNTER — Other Ambulatory Visit: Payer: BC Managed Care – PPO

## 2022-07-15 ENCOUNTER — Ambulatory Visit: Payer: BC Managed Care – PPO | Admitting: Sports Medicine

## 2022-07-20 ENCOUNTER — Ambulatory Visit: Payer: BC Managed Care – PPO | Admitting: Sports Medicine

## 2022-07-27 DIAGNOSIS — Z9889 Other specified postprocedural states: Secondary | ICD-10-CM | POA: Diagnosis not present

## 2022-07-27 DIAGNOSIS — H2512 Age-related nuclear cataract, left eye: Secondary | ICD-10-CM | POA: Diagnosis not present

## 2022-08-06 DIAGNOSIS — G4733 Obstructive sleep apnea (adult) (pediatric): Secondary | ICD-10-CM | POA: Diagnosis not present

## 2022-08-12 DIAGNOSIS — G4733 Obstructive sleep apnea (adult) (pediatric): Secondary | ICD-10-CM | POA: Diagnosis not present

## 2022-08-18 ENCOUNTER — Ambulatory Visit
Admission: RE | Admit: 2022-08-18 | Discharge: 2022-08-18 | Disposition: A | Discharge status: Home / Self Care | Payer: No Typology Code available for payment source | Source: Ambulatory Visit | Attending: Internal Medicine | Admitting: Internal Medicine

## 2022-08-18 DIAGNOSIS — E785 Hyperlipidemia, unspecified: Secondary | ICD-10-CM

## 2022-08-27 ENCOUNTER — Encounter: Payer: Self-pay | Admitting: Sports Medicine

## 2022-08-27 ENCOUNTER — Ambulatory Visit: Payer: BC Managed Care – PPO | Admitting: Sports Medicine

## 2022-08-27 VITALS — BP 124/84 | HR 70 | Ht 70.0 in | Wt 190.0 lb

## 2022-08-27 DIAGNOSIS — M545 Low back pain, unspecified: Secondary | ICD-10-CM | POA: Diagnosis not present

## 2022-08-27 DIAGNOSIS — M5136 Other intervertebral disc degeneration, lumbar region: Secondary | ICD-10-CM | POA: Diagnosis not present

## 2022-08-27 NOTE — Progress Notes (Signed)
   Subjective:    Patient ID: Steven Hoffman, male    DOB: [birth year], 57 y.o.   MRN: [id number]  HPI  Steven Hoffman presents to discuss another episode of low back pain. This one was not severe. He had several weeks of physical therapy which were extremely helpful. He has been faithful about doing his HEP 6-7 days/ week. Meloxicam is helpful. Today he is feeling pretty good. He has noticed that each episode of pain occurs upon return from the beach.No radiculopathy. Previous xrays showed some DDD and facet arthropathy as well as bilateral assimilation joints at L5-S1.    Review of Systems     Objective:   Physical Exam   Lumbar spine: FROM. No TTP. Strength intact distally.     Assessment & Plan:   Reoccuring low back pain with x -ray evidence of facet arthropathy, DDD, and L5-S1 assimilation joints  I've recommended an MRI for potential pre-procedural planning. Steven Hoffman may benefit from facet injections at a latter date if his symptoms once again worsen. He has already had several weeks of formal PT. I will My Chart message him with those results when available.

## 2022-08-30 DIAGNOSIS — D692 Other nonthrombocytopenic purpura: Secondary | ICD-10-CM | POA: Diagnosis not present

## 2022-08-30 DIAGNOSIS — L821 Other seborrheic keratosis: Secondary | ICD-10-CM | POA: Diagnosis not present

## 2022-08-30 DIAGNOSIS — D225 Melanocytic nevi of trunk: Secondary | ICD-10-CM | POA: Diagnosis not present

## 2022-08-30 DIAGNOSIS — L853 Xerosis cutis: Secondary | ICD-10-CM | POA: Diagnosis not present

## 2022-09-05 DIAGNOSIS — G4733 Obstructive sleep apnea (adult) (pediatric): Secondary | ICD-10-CM | POA: Diagnosis not present

## 2022-09-07 ENCOUNTER — Encounter: Payer: Self-pay | Admitting: Sports Medicine

## 2022-09-07 DIAGNOSIS — H35371 Puckering of macula, right eye: Secondary | ICD-10-CM | POA: Diagnosis not present

## 2022-09-07 DIAGNOSIS — H59812 Chorioretinal scars after surgery for detachment, left eye: Secondary | ICD-10-CM | POA: Diagnosis not present

## 2022-09-08 ENCOUNTER — Telehealth: Payer: Self-pay | Admitting: Family Medicine

## 2022-09-08 NOTE — Telephone Encounter (Signed)
LVM and sent mychart msg informing pt of need to reschedule 10/27/22 appt - NP out

## 2022-09-10 ENCOUNTER — Ambulatory Visit
Admission: RE | Admit: 2022-09-10 | Discharge: 2022-09-10 | Disposition: A | Discharge status: Home / Self Care | Payer: BC Managed Care – PPO | Source: Ambulatory Visit | Attending: Sports Medicine | Admitting: Sports Medicine

## 2022-09-10 DIAGNOSIS — M545 Low back pain, unspecified: Secondary | ICD-10-CM

## 2022-09-10 DIAGNOSIS — M47816 Spondylosis without myelopathy or radiculopathy, lumbar region: Secondary | ICD-10-CM | POA: Diagnosis not present

## 2022-09-12 DIAGNOSIS — G4733 Obstructive sleep apnea (adult) (pediatric): Secondary | ICD-10-CM | POA: Diagnosis not present

## 2022-09-20 ENCOUNTER — Encounter: Payer: Self-pay | Admitting: Sports Medicine

## 2022-09-22 NOTE — Telephone Encounter (Signed)
I spoke with Steven Hoffman on the phone today after reviewing MRI findings of his lumbar spine.  He has some very mild facet arthropathy and some mild disc bulging as well as some transitional anatomy.  No signs of lumbar disc rupture or advanced facet arthropathy.  I recommended that he keep some meloxicam and muscle relaxers on hand to use if needed.  He also has a good understanding of home exercises to hopefully prevent future occurrences.  We had discussed prednisone as an option also but he is type I diabetic and really did not get much relief from the Dosepak that his PCP prescribed.  He will let me know if he has any additional questions.  Otherwise, he will follow-up with me as needed.

## 2022-10-04 ENCOUNTER — Ambulatory Visit: Payer: BC Managed Care – PPO | Attending: Internal Medicine | Admitting: Internal Medicine

## 2022-10-04 ENCOUNTER — Encounter: Payer: Self-pay | Admitting: Internal Medicine

## 2022-10-04 VITALS — BP 124/76 | HR 58 | Ht 70.0 in | Wt 194.4 lb

## 2022-10-04 DIAGNOSIS — I10 Essential (primary) hypertension: Secondary | ICD-10-CM | POA: Diagnosis not present

## 2022-10-04 NOTE — Progress Notes (Signed)
HPI Mr. Majchrzak presents today for screening for CAD. He is a pleasant 57 yo man who is retired from [employer] who has HTN and DM. He had a coronary calcium scan done a couple of weeks ago which showed a score of 78. The patient has been insulin dependent for 30 years, which occurred after he had experienced an unusual viral illness. Remotely he has had exercise testing which was negative. He works out 6 days a week. He has no limit to exercise and has not noticed any changes.  His last hgbA1C was 5.4! After his CT calcium score he was started on crestor 10 mg daily.  No Known Allergies   Current Outpatient Medications  Medication Sig Dispense Refill   insulin aspart (NOVOLOG) 100 UNIT/ML injection Inject 12-20 Units into the skin 3 (three) times daily before meals.     meloxicam (MOBIC) 15 MG tablet Take 15 mg by mouth as needed for pain.     methocarbamol (ROBAXIN) 500 MG tablet Take 500 mg by mouth every 6 (six) hours as needed for muscle spasms.     rosuvastatin (CRESTOR) 10 MG tablet Take 10 mg by mouth daily.     telmisartan (MICARDIS) 40 MG tablet Take 40 mg by mouth daily.     No current facility-administered medications for this visit.     Past Medical History:  Diagnosis Date   Diabetes mellitus without complication (HCC)    HLD (hyperlipidemia)    Hypertension     ROS:   All systems reviewed and negative except as noted in the HPI.   Past Surgical History:  Procedure Laterality Date   PARTIAL NEPHRECTOMY Right 1980     Family History  Problem Relation Age of Onset   Hypothyroidism Mother    Nephrolithiasis Father    Prostate cancer Father    Heart disease Father      Social History   Socioeconomic History   Marital status: Married    Spouse name: Not on file   Number of children: Not on file   Years of education: Not on file   Highest education level: Not on file  Occupational History   Not on file  Tobacco Use   Smoking status: Never    Smokeless tobacco: Never  Substance and Sexual Activity   Alcohol use: Yes    Alcohol/week: 7.0 - 14.0 standard drinks of alcohol    Types: 7 - 14 Standard drinks or equivalent per week   Drug use: Never   Sexual activity: Not on file  Other Topics Concern   Not on file  Social History Narrative   Not on file   Social Determinants of Health   Financial Resource Strain: Not on file  Food Insecurity: Not on file  Transportation Needs: Not on file  Physical Activity: Not on file  Stress: Not on file  Social Connections: Unknown (06/23/2021)   [facility]   Social Network    Social Network: Not on file  Intimate Partner Violence: Unknown (05/15/2021)   Received from [facility]   HITS    Physically Hurt: Not on file    Insult or Talk Down To: Not on file    Threaten Physical Harm: Not on file    Scream or Curse: Not on file     BP 124/76   Pulse (!) 58   Ht 5\' 10"  (1.778 m)   Wt 194 lb 6.4 oz (88.2 kg)   SpO2 98%  BMI 27.89 kg/m   Physical Exam:  Well appearing NAD HEENT: Unremarkable;no fundoscopic exam performed.  Neck:  No JVD, no thyromegally Lymphatics:  No adenopathy Back:  No CVA tenderness Lungs:  Clear with no wheezes HEART:  Regular rate rhythm, no murmurs, no rubs, no clicks Abd:  soft, positive bowel sounds, no organomegally, no rebound, no guarding Skin:  No rashes no nodules Neuro:  CN II through XII intact, motor grossly intact  EKG - nsr  DEVICE  Normal device function.  See PaceArt for details.   Assess/Plan: CAD - no evidence of obstruction. I have asked him to continue his healthy lifestyle and notify me if there are any changes. He has no angina or anginal equivalent symptoms. CT scan noted.  Dyslipidemia - he will need fasting lipids in a month. I will reach out to him to see if these have been ordered by his primary MD. HTN - his bp is well controlled.   Sharlot Gowda Syliva Mee,MD

## 2022-10-04 NOTE — Patient Instructions (Signed)

## 2022-10-27 ENCOUNTER — Ambulatory Visit: Payer: BC Managed Care – PPO | Admitting: Family Medicine

## 2022-11-02 DIAGNOSIS — G4733 Obstructive sleep apnea (adult) (pediatric): Secondary | ICD-10-CM | POA: Diagnosis not present

## 2022-11-08 DIAGNOSIS — E785 Hyperlipidemia, unspecified: Secondary | ICD-10-CM | POA: Diagnosis not present

## 2022-11-15 DIAGNOSIS — Z23 Encounter for immunization: Secondary | ICD-10-CM | POA: Diagnosis not present

## 2022-11-15 DIAGNOSIS — I10 Essential (primary) hypertension: Secondary | ICD-10-CM | POA: Diagnosis not present

## 2022-11-15 DIAGNOSIS — E109 Type 1 diabetes mellitus without complications: Secondary | ICD-10-CM | POA: Diagnosis not present

## 2022-12-01 ENCOUNTER — Encounter: Payer: Self-pay | Admitting: Family Medicine

## 2022-12-01 ENCOUNTER — Ambulatory Visit: Payer: BC Managed Care – PPO | Admitting: Family Medicine

## 2022-12-01 VITALS — BP 127/83 | HR 74 | Ht 70.0 in | Wt 190.5 lb

## 2022-12-01 DIAGNOSIS — G4733 Obstructive sleep apnea (adult) (pediatric): Secondary | ICD-10-CM | POA: Diagnosis not present

## 2022-12-01 NOTE — Progress Notes (Signed)
PATIENT: Steven Hoffman DOB: August 28, 1965  REASON FOR VISIT: follow up HISTORY FROM: patient  Chief Complaint  Patient presents with   Room 1    Pt is here Alone. Pt states that he has been doing good with his CPAP Machine. Pt states that he is having leakage. Pt states that he needs the fabric part to his mask. Pt states that he wants to discuss the Dental Guard and wether or not he is a candidate for that.      HISTORY OF PRESENT ILLNESS:  12/01/22 ALL:  Steven Hoffman returns for follow up for OSA on CPAP. He was last seen by Dr Vickey Huger 10/2021 and doing well. He continues to use therapy nightly for about 7.5 hours, on average. He denies concerns with machine or supplies. He does rest well on therapy. When not using home machine, he is using a travel machine. He has called inquiring about Zoll Remede phrenic nerve stimulator and Inspire. He was curious about dental appliance.     10/06/2020 ALL: Steven Hoffman is a 58 y.o. male here today for follow up for OSA on CPAP.  HST 05/2020 showed moderate/severe OSA with AHI 22/hr and REM AHI 42.5/hr. AutoPAP was ordered. He feels that he is doing very well. He was not able to tolerate first CPAP machine but recently received a ResMed machine and is doing great. He has not noticed any significant difference in how he sleeps but reports he slept well previously. His blood pressure has been normal. He has not returned to PCP yet for updated blood work. He is using a nasal mask and tolerating well.     HISTORY: (copied from Dr Dohmeier's previous note)  Steven Hoffman is a 9 - year -old  Caucasian male patient who is seen here upon a referral on 04/08/2020 from Dr. Felipa Eth, MD, PhD, concerned about elevated Hgb levels. The patient reports that he has never been as smoker, he does not have a history of asthma or exercise-induced respiratory problems.  He does not travel to high altitudes and has not done so recently.  The elevated hemoglobin has thus far  been unexplained but a hematological reason with an genetic means has not been found.  One question is if the patient may have silent hypoxemia at night which can be related to an undiagnosed sleep apnea.  Steven Hoffman is aware of his snoring but he has not been told that he frequently quits breathing during sleep.  He does not report morning headaches.  Chief concern according to patient : " no answer found yet"   Steven Hoffman  has a past medical history of Diabetes mellitus type 1 at age 67 and his son was diagnosed at age 70, without complication (HCC), HLD (hyperlipidemia), and Hypertension. Not on insulin pump.     Sleep relevant medical history: Nocturia, 1-2 , dreams frequently on melatonin,. Family medical /sleep history: NO other family member on CPAP with OSA, insomnia, sleep walkers.    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  Pets are present, one dog. Tobacco use- never .  ETOH use - wine-regular with dinner. Caffeine intake in form of Coffee( 2 cups in AM  ) Soda( /) Tea (/) or energy drinks. Regular exercise in form of walking,  yoga, peleton, .     Sleep habits are as follows: The patient's dinner time is between 6.30 PM. The patient goes to bed at 9.30 PM and continues to sleep for 7 hours, wakes for 1-2 bathroom breaks, the first time at 3 AM.   The preferred sleep position is  Supine , with the support of 1 pillow.  Dreams are reportedly frequent/vivid- since melatonin.  6-7  AM is the usual rise time. The patient wakes up spontaneously. He reports feeling refreshed or restored in AM, with symptoms such as naps are not taken.    REVIEW OF SYSTEMS: Out of a complete 14 system review of symptoms, the patient complains only of the following symptoms, none and all other reviewed systems are  negative.  ESS: 2/24  ALLERGIES: No Known Allergies   HOME MEDICATIONS: Outpatient Medications Prior to Visit  Medication Sig Dispense Refill   insulin aspart (NOVOLOG) 100 UNIT/ML injection Inject 12-20 Units into the skin 3 (three) times daily before meals.     meloxicam (MOBIC) 15 MG tablet Take 15 mg by mouth as needed for pain.     methocarbamol (ROBAXIN) 500 MG tablet Take 500 mg by mouth every 6 (six) hours as needed for muscle spasms.     rosuvastatin (CRESTOR) 10 MG tablet Take 10 mg by mouth daily.     telmisartan (MICARDIS) 40 MG tablet Take 40 mg by mouth daily.     No facility-administered medications prior to visit.    PAST MEDICAL HISTORY: Past Medical History:  Diagnosis Date   Diabetes mellitus without complication (HCC)    HLD (hyperlipidemia)    Hypertension     PAST SURGICAL HISTORY: Past Surgical History:  Procedure Laterality Date   CATARACT EXTRACTION Left    Right Eye as well   PARTIAL NEPHRECTOMY Right 1980    FAMILY HISTORY: Family History  Problem Relation Age of Onset   Hypothyroidism Mother    Nephrolithiasis Father    Prostate cancer Father    Heart disease Father     SOCIAL HISTORY: Social History   Socioeconomic History   Marital status: Married    Spouse name: Not on file   Number of children: Not on file   Years of education: Not on file   Highest education level: Not on file  Occupational History   Not on file  Tobacco Use   Smoking status: Never   Smokeless tobacco: Never  Substance and Sexual Activity   Alcohol use: Yes    Alcohol/week: 7.0 - 14.0 standard drinks of alcohol    Types: 7 - 14 Standard drinks or equivalent per week   Drug use: Never   Sexual activity: Not on file  Other Topics Concern   Not on file  Social History Narrative   Not on file   Social Determinants of Health   Financial Resource Strain: Not on file  Food Insecurity: Not on file  Transportation Needs: Not on file  Physical Activity:  Not on file  Stress: Not on file  Social Connections: Unknown (06/23/2021)   [facility]   Social Network    Social Network: Not on file  Intimate Partner Violence: Unknown (05/15/2021)   Received from [facility], [facility]   HITS    Physically Hurt: Not on file    Insult or Talk Down To: Not on file    Threaten Physical Harm: Not on file    Scream or Curse: Not on file  PHYSICAL EXAM  Vitals:   12/01/22 1518  BP: 127/83  Pulse: 74  Weight: 190 lb 8 oz (86.4 kg)  Height: 5\' 10"  (1.778 m)    Body mass index is 27.33 kg/m.  Generalized: Well developed, in no acute distress  Cardiology: normal rate and rhythm, no murmur noted Respiratory: clear to auscultation bilaterally  Neurological examination  Mentation: Alert oriented to time, place, history taking. Follows all commands speech and language fluent Cranial nerve II-XII: Pupils were equal round reactive to light. Extraocular movements were full, visual field were full  Motor: The motor testing reveals 5 over 5 strength of all 4 extremities. Good symmetric motor tone is noted throughout.  Gait and station: Gait is normal.    DIAGNOSTIC DATA (LABS, IMAGING, TESTING) - I reviewed patient records, labs, notes, testing and imaging myself where available.      No data to display           Lab Results  Component Value Date   WBC 4.3 10/26/2021   HGB 16.4 10/26/2021   HCT 46.5 10/26/2021   MCV 92 10/26/2021   PLT 203 10/26/2021      Component Value Date/Time   NA 138 10/26/2021 0905   K 4.7 10/26/2021 0905   CL 99 10/26/2021 0905   CO2 26 10/26/2021 0905   GLUCOSE 144 (H) 10/26/2021 0905   GLUCOSE 123 (H) 01/10/2020 1149   BUN 13 10/26/2021 0905   CREATININE 1.10 10/26/2021 0905   CREATININE 1.11 01/10/2020 1149   CALCIUM 9.6 10/26/2021 0905   PROT 6.7 10/26/2021 0905   ALBUMIN 4.3 10/26/2021 0905   AST 39 10/26/2021 0905   AST 26 01/10/2020 1149   ALT 32  10/26/2021 0905   ALT 35 01/10/2020 1149   ALKPHOS 89 10/26/2021 0905   BILITOT 1.5 (H) 10/26/2021 0905   BILITOT 1.1 01/10/2020 1149   GFRNONAA >60 01/10/2020 1149   No results found for: "CHOL", "HDL", "LDLCALC", "LDLDIRECT", "TRIG", "CHOLHDL" No results found for: "HGBA1C" No results found for: "VITAMINB12" No results found for: "TSH"   ASSESSMENT AND PLAN 57 y.o. year old male  has a past medical history of Diabetes mellitus without complication (HCC), HLD (hyperlipidemia), and Hypertension. here with     ICD-10-CM   1. OSA on CPAP  G47.33 For home use only DME continuous positive airway pressure (CPAP)      Marcina Millard is doing well on CPAP therapy. Compliance report reveals excellent compliance. He was encouraged to continue using CPAP nightly and for greater than 4 hours each night. Risks of untreated sleep apnea review and education materials provided. We have reviewed different treatment options. CPAP is gold standard and he is doing well. He will let me know should he decide to pursue different treatment option. He will continue close follow up with PCP for lab monitoring. Healthy lifestyle habits encouraged. He will follow up in 1 year, sooner if needed. He verbalizes understanding and agreement with this plan.    Orders Placed This Encounter  Procedures   For home use only DME continuous positive airway pressure (CPAP)    Supplies    Order Specific Question:   Length of Need    Answer:   Lifetime    Order Specific Question:   Patient has OSA or probable OSA    Answer:   Yes    Order Specific Question:   Is the patient currently using CPAP in the home    Answer:   Yes  Order Specific Question:   Settings    Answer:   Other see comments    Order Specific Question:   CPAP supplies needed    Answer:   Mask, headgear, cushions, filters, heated tubing and water chamber      No orders of the defined types were placed in this encounter.      Shawnie Dapper, FNP-C  12/01/2022, 3:45 PM [facility] [address], [address] [place], [place] [address] [phone]

## 2022-12-01 NOTE — Patient Instructions (Addendum)

## 2022-12-02 DIAGNOSIS — G4733 Obstructive sleep apnea (adult) (pediatric): Secondary | ICD-10-CM | POA: Diagnosis not present

## 2022-12-14 DIAGNOSIS — E109 Type 1 diabetes mellitus without complications: Secondary | ICD-10-CM | POA: Diagnosis not present

## 2022-12-14 DIAGNOSIS — Z961 Presence of intraocular lens: Secondary | ICD-10-CM | POA: Diagnosis not present

## 2022-12-14 DIAGNOSIS — H26491 Other secondary cataract, right eye: Secondary | ICD-10-CM | POA: Diagnosis not present

## 2022-12-14 DIAGNOSIS — H524 Presbyopia: Secondary | ICD-10-CM | POA: Diagnosis not present

## 2022-12-22 DIAGNOSIS — H26491 Other secondary cataract, right eye: Secondary | ICD-10-CM | POA: Diagnosis not present

## 2023-01-02 DIAGNOSIS — G4733 Obstructive sleep apnea (adult) (pediatric): Secondary | ICD-10-CM | POA: Diagnosis not present

## 2023-01-04 DIAGNOSIS — H30041 Focal chorioretinal inflammation, macular or paramacular, right eye: Secondary | ICD-10-CM | POA: Diagnosis not present

## 2023-01-04 DIAGNOSIS — H43812 Vitreous degeneration, left eye: Secondary | ICD-10-CM | POA: Diagnosis not present

## 2023-01-04 DIAGNOSIS — H31001 Unspecified chorioretinal scars, right eye: Secondary | ICD-10-CM | POA: Diagnosis not present

## 2023-01-04 DIAGNOSIS — H35351 Cystoid macular degeneration, right eye: Secondary | ICD-10-CM | POA: Diagnosis not present

## 2023-01-12 DIAGNOSIS — H401121 Primary open-angle glaucoma, left eye, mild stage: Secondary | ICD-10-CM | POA: Diagnosis not present

## 2023-01-12 DIAGNOSIS — Z961 Presence of intraocular lens: Secondary | ICD-10-CM | POA: Diagnosis not present

## 2023-01-26 DIAGNOSIS — H40012 Open angle with borderline findings, low risk, left eye: Secondary | ICD-10-CM | POA: Diagnosis not present

## 2023-01-26 DIAGNOSIS — H30041 Focal chorioretinal inflammation, macular or paramacular, right eye: Secondary | ICD-10-CM | POA: Diagnosis not present

## 2023-01-26 DIAGNOSIS — H35351 Cystoid macular degeneration, right eye: Secondary | ICD-10-CM | POA: Diagnosis not present

## 2023-01-26 DIAGNOSIS — H59813 Chorioretinal scars after surgery for detachment, bilateral: Secondary | ICD-10-CM | POA: Diagnosis not present

## 2023-04-18 DIAGNOSIS — E109 Type 1 diabetes mellitus without complications: Secondary | ICD-10-CM | POA: Diagnosis not present

## 2023-04-18 DIAGNOSIS — Z125 Encounter for screening for malignant neoplasm of prostate: Secondary | ICD-10-CM | POA: Diagnosis not present

## 2023-04-18 DIAGNOSIS — E785 Hyperlipidemia, unspecified: Secondary | ICD-10-CM | POA: Diagnosis not present

## 2023-04-25 DIAGNOSIS — Z1339 Encounter for screening examination for other mental health and behavioral disorders: Secondary | ICD-10-CM | POA: Diagnosis not present

## 2023-04-25 DIAGNOSIS — Z1331 Encounter for screening for depression: Secondary | ICD-10-CM | POA: Diagnosis not present

## 2023-04-25 DIAGNOSIS — E109 Type 1 diabetes mellitus without complications: Secondary | ICD-10-CM | POA: Diagnosis not present

## 2023-04-25 DIAGNOSIS — Z0000 Encounter for general adult medical examination without abnormal findings: Secondary | ICD-10-CM | POA: Diagnosis not present

## 2023-04-25 DIAGNOSIS — R82998 Other abnormal findings in urine: Secondary | ICD-10-CM | POA: Diagnosis not present

## 2023-04-25 DIAGNOSIS — I10 Essential (primary) hypertension: Secondary | ICD-10-CM | POA: Diagnosis not present

## 2023-05-18 ENCOUNTER — Other Ambulatory Visit: Payer: Self-pay

## 2023-05-18 ENCOUNTER — Other Ambulatory Visit (HOSPITAL_COMMUNITY): Payer: Self-pay

## 2023-05-18 MED ORDER — DEXCOM G6 SENSOR MISC
6 refills | Status: AC
  Filled 2023-05-18: qty 9, 90d supply, fill #0

## 2023-05-19 ENCOUNTER — Other Ambulatory Visit: Payer: Self-pay

## 2023-06-08 DIAGNOSIS — H401221 Low-tension glaucoma, left eye, mild stage: Secondary | ICD-10-CM | POA: Diagnosis not present

## 2023-07-20 ENCOUNTER — Encounter: Payer: Self-pay | Admitting: Family Medicine

## 2023-07-20 ENCOUNTER — Other Ambulatory Visit: Payer: Self-pay

## 2023-07-20 ENCOUNTER — Ambulatory Visit: Admitting: Family Medicine

## 2023-07-20 VITALS — BP 130/88 | Ht 70.0 in | Wt 182.0 lb

## 2023-07-20 DIAGNOSIS — M25522 Pain in left elbow: Secondary | ICD-10-CM | POA: Diagnosis not present

## 2023-07-20 DIAGNOSIS — M7712 Lateral epicondylitis, left elbow: Secondary | ICD-10-CM | POA: Insufficient documentation

## 2023-07-20 MED ORDER — NITROGLYCERIN 0.2 MG/HR TD PT24: MEDICATED_PATCH | TRANSDERMAL | 1 refills | Status: AC

## 2023-07-20 NOTE — Assessment & Plan Note (Signed)
-   The pt's hx, exam, and US  are consistent with lateral epicondylitis - He will start using his home tennis elbow strap during activity and modify his golf swing the reduce the use of this wrist extensors.  - He will also start the nitroglycerine patch protocol and icing - Home exercises given today.  - We will follow up in 6 weeks to re-ultrasound

## 2023-07-20 NOTE — Progress Notes (Signed)
  Steven Hoffman - 58 y.o. male MRN [id number]  Date of birth: 03-03-1965  PCP: Lonzie Robins, MD  Subjective:  No chief complaint on file.  Left lateral elbow pain  HPI: Past Medical, Surgical, Social, and Family History Reviewed & Updated per EMR.   Patient is a 58 y.o. male here for pain over his left lateral elbow that started after a recent trip to [place]. During that time he increased his golf play significantly and developed the pain afterward. He has tried prn ibuprofen and some intermittent stretching but no other therapies. He has had this twice in the past but not for years. He previously had two steroid injections which did give some relief. He denies any swelling, redness, bruising, numbness, or weakness.    Past Surgical History:  Procedure Laterality Date   CATARACT EXTRACTION Left    Right Eye as well   PARTIAL NEPHRECTOMY Right 1980    No Known Allergies      Objective:  Physical Exam: VS: BP:130/88  HR: bpm  TEMP: ( )  RESP:   HT:5' 10 (177.8 cm)   WT:182 lb (82.6 kg)  BMI:26.11  Gen: Well developed, NAD, speaks clearly, comfortable in exam room Respiratory: Normal work of breathing on room air, no respiratory distress Skin: No rashes, abrasions, or ecchymosis MSK:  The left elbow appears normal Full ROM Strength 5/5 in all directions Grip strength testing and resisted supination elicit pain over the proximal extensor tendons Pain not elicited with resisted finger extension and mild discomfort with wrist extension TTP over the proximal extensors just 2cm distal to the lateral epicondyle Neuro: no sensory deficits  Limited US  of the left elbow:  There is hypoechoic fluid collection just shallow to the radius within the proximal muscle fibers at the myotendinous transition.  Doppler demonstrates increased vascular flow  Summary: small partial tear of the proximal extensors at the myotendinous junction  Ultrasound and interpretation by Dr. Dannis Dy  and Dr. Howard Macho    Assessment & Plan:   Lateral epicondylitis of left elbow - The pt's hx, exam, and US  are consistent with lateral epicondylitis - He will start using his home tennis elbow strap during activity and modify his golf swing the reduce the use of this wrist extensors.  - He will also start the nitroglycerine patch protocol and icing - Home exercises given today.  - We will follow up in 6 weeks to re-ultrasound   Berneda Bridges MD [facility] Sports Medicine Fellow

## 2023-07-20 NOTE — Patient Instructions (Signed)

## 2023-07-27 DIAGNOSIS — H30041 Focal chorioretinal inflammation, macular or paramacular, right eye: Secondary | ICD-10-CM | POA: Diagnosis not present

## 2023-07-27 DIAGNOSIS — H31093 Other chorioretinal scars, bilateral: Secondary | ICD-10-CM | POA: Diagnosis not present

## 2023-07-27 DIAGNOSIS — H35351 Cystoid macular degeneration, right eye: Secondary | ICD-10-CM | POA: Diagnosis not present

## 2023-07-27 DIAGNOSIS — H40012 Open angle with borderline findings, low risk, left eye: Secondary | ICD-10-CM | POA: Diagnosis not present

## 2023-08-08 DIAGNOSIS — Z79899 Other long term (current) drug therapy: Secondary | ICD-10-CM | POA: Diagnosis not present

## 2023-08-08 DIAGNOSIS — Z794 Long term (current) use of insulin: Secondary | ICD-10-CM | POA: Diagnosis not present

## 2023-08-08 DIAGNOSIS — T44991A Poisoning by other drug primarily affecting the autonomic nervous system, accidental (unintentional), initial encounter: Secondary | ICD-10-CM | POA: Diagnosis not present

## 2023-08-08 DIAGNOSIS — E119 Type 2 diabetes mellitus without complications: Secondary | ICD-10-CM | POA: Diagnosis not present

## 2023-08-08 DIAGNOSIS — E7800 Pure hypercholesterolemia, unspecified: Secondary | ICD-10-CM | POA: Diagnosis not present

## 2023-08-08 DIAGNOSIS — I10 Essential (primary) hypertension: Secondary | ICD-10-CM | POA: Diagnosis not present

## 2023-09-05 ENCOUNTER — Other Ambulatory Visit: Payer: Self-pay

## 2023-09-05 ENCOUNTER — Encounter: Payer: Self-pay | Admitting: Family Medicine

## 2023-09-05 ENCOUNTER — Ambulatory Visit: Admitting: Family Medicine

## 2023-09-05 VITALS — BP 132/84 | Ht 70.0 in | Wt 183.0 lb

## 2023-09-05 DIAGNOSIS — M7712 Lateral epicondylitis, left elbow: Secondary | ICD-10-CM

## 2023-09-05 NOTE — Patient Instructions (Signed)

## 2023-09-05 NOTE — Progress Notes (Signed)
 DATE OF VISIT: 09/05/2023        Steven Hoffman DOB: 09-May-1965 MRN: [id number]  CC:  f/u Lt elbow  History of present Illness: Steven Hoffman is a 58 y.o. male who presents for a follow-up visit for Lt elbow Last seen by us  07/20/23 - dx with common extensor tendon tear of the left elbow -Was started on topical nitroglycerin  therapy and given home exercise program Since last visit continues to have ongoing pain Has been doing home exercises twice a day, also working with a trainer He does symptoms worse with lifting pain/cast iron skillet when cooking Has been able to return to golf without using his tennis elbow strap with minimal pain Stopped using topical nitroglycerin  about 1 week ago because he felt it was not helping - Denies any rash or headaches or other side effects Denies numbness/tingling Has a lot of travel scheduled, his son will be starting medical school at [employer] in the near future Also has several golf trips planned  Work: retired  Medications:  Outpatient Encounter Medications as of 09/05/2023  Medication Sig   Continuous Glucose Sensor (DEXCOM G6 SENSOR) MISC Change every 10 days to monitor blood glucose continuously.   insulin aspart (NOVOLOG) 100 UNIT/ML injection Inject 12-20 Units into the skin 3 (three) times daily before meals.   meloxicam (MOBIC) 15 MG tablet Take 15 mg by mouth as needed for pain.   methocarbamol (ROBAXIN) 500 MG tablet Take 500 mg by mouth every 6 (six) hours as needed for muscle spasms.   nitroGLYCERIN  (NITRODUR - DOSED IN MG/24 HR) 0.2 mg/hr patch Use 1/4 patch daily to the affected area.   rosuvastatin (CRESTOR) 10 MG tablet Take 10 mg by mouth daily.   telmisartan (MICARDIS) 40 MG tablet Take 40 mg by mouth daily.   No facility-administered encounter medications on file as of 09/05/2023.    Allergies: has no known allergies.  Physical Examination: Vitals: BP 132/84   Ht 5' 10 (1.778 m)   Wt 183 lb (83 kg)   BMI  26.26 kg/m  GENERAL:  Steven Hoffman is a 58 y.o. male appearing their stated age, alert and oriented x 3, in no apparent distress.  SKIN: no rashes or lesions, skin clean, dry, intact MSK: Elbow: Left elbow without gross deformity.  Full range of motion without pain.  Mild tenderness palpation over the lateral epicondyle and the common extensor tendon.  Increased pain with resisted wrist extension and resisted third finger extension.  No pain with resisted wrist flexion.  He has normal grip strength. Right elbow with full range of motion pain, weakness, instability Neurovascular intact distally  Radiology: Limited MSK ultrasound left lateral elbow Date: 09/05/2023 Indication: Follow-up, common extensor tear Findings: -Hypoechoic change noted along the common extensor tendon at the lateral epicondyle.  Partial tearing of common extensor tendon noted with increased hyperemia and Doppler flow.  Well-visualized in short and long axis views.  Appears to involve about 25-50% of the tendon - Normal-appearing radiocapitellar joint  Impression: - Ongoing partial tear of the common extensor tendon at the lateral epicondyle with increased Doppler flow and hyperemia Images and interpretation completed by Rainell Cedar, DO    Assessment & Plan Lateral epicondylitis of left elbow Ongoing left lateral elbow pain with partial tear of the common extensor tendons as previously seen on ultrasound 07/20/2023.  Limited improvement with home exercise program and nitroglycerin  to this point  Plan: - Ultrasound findings reviewed with patient today.  Ongoing treatment  options discussed - He would be interested in trial of shockwave therapy.  Trial treatment was completed today as noted below.  He is aware of future cost of ongoing treatments if he wishes to proceed. - He should resume topical nitroglycerin  therapy.  Would be okay for him to increase dosage to half patch applied to the left lateral elbow, leave in  place for 24 hours, change daily.  He is aware that higher doses may lead to potential headaches for a short period as he adjusts.  He has not had headaches thus far, so expect he should tolerate well - He will continue with his home exercises as he is doing.  We could consider formal physical therapy in the future as well as consideration of dry needling - It is recommended that he start wearing his counterforce brace when playing golf - He will follow-up with me in 1 to 2 weeks for repeat shockwave therapy to the left elbow   ESWT Procedure Note: Treatment #1  Diagnosis: Left lateral epicondylitis with partial common extensor tendon tear  Procedure Details  Consent for the procedure was obtained. Time out was performed. The anatomical landmarks and target areas for the procedure were identified.   PROCEDURE: ESWT was discussed with the patient in detail.  The risk and benefits were explained.  The point of maximal tenderness was identified and the oscillator probe was applied with moderate pressure. Treatment adjusted as mediated by patient feedback/pain.  Left lateral epicondyle, extensor bundle was targeted for ESWT  Preset: Epicondylitis Power level: 110 MJ Frequency: 90 hz Impulses: 2500 Head size: Large   Complications:  None; patient tolerated the procedure well.  Plan:   ESWT completed today as noted above Return in 1-2 week for ESWT procedure #2 Procedure aftercare reviewed Recommended avoiding strenuous activities and using OTC analgesia prn.      Patient expressed understanding & agreement with above.  Encounter Diagnosis  Name Primary?   Lateral epicondylitis of left elbow Yes    Orders Placed This Encounter  Procedures   US  LIMITED JOINT SPACE STRUCTURES UP LEFT

## 2023-09-05 NOTE — Assessment & Plan Note (Signed)
 Ongoing left lateral elbow pain with partial tear of the common extensor tendons as previously seen on ultrasound 07/20/2023.  Limited improvement with home exercise program and nitroglycerin  to this point  Plan: - Ultrasound findings reviewed with patient today.  Ongoing treatment options discussed - He would be interested in trial of shockwave therapy.  Trial treatment was completed today as noted below.  He is aware of future cost of ongoing treatments if he wishes to proceed. - He should resume topical nitroglycerin  therapy.  Would be okay for him to increase dosage to half patch applied to the left lateral elbow, leave in place for 24 hours, change daily.  He is aware that higher doses may lead to potential headaches for a short period as he adjusts.  He has not had headaches thus far, so expect he should tolerate well - He will continue with his home exercises as he is doing.  We could consider formal physical therapy in the future as well as consideration of dry needling - It is recommended that he start wearing his counterforce brace when playing golf - He will follow-up with me in 1 to 2 weeks for repeat shockwave therapy to the left elbow   ESWT Procedure Note: Treatment #1  Diagnosis: Left lateral epicondylitis with partial common extensor tendon tear  Procedure Details  Consent for the procedure was obtained. Time out was performed. The anatomical landmarks and target areas for the procedure were identified.   PROCEDURE: ESWT was discussed with the patient in detail.  The risk and benefits were explained.  The point of maximal tenderness was identified and the oscillator probe was applied with moderate pressure. Treatment adjusted as mediated by patient feedback/pain.  Left lateral epicondyle, extensor bundle was targeted for ESWT  Preset: Epicondylitis Power level: 110 MJ Frequency: 90 hz Impulses: 2500 Head size: Large   Complications:  None; patient tolerated the procedure  well.  Plan:   ESWT completed today as noted above Return in 1-2 week for ESWT procedure #2 Procedure aftercare reviewed Recommended avoiding strenuous activities and using OTC analgesia prn.

## 2023-09-13 ENCOUNTER — Ambulatory Visit (INDEPENDENT_AMBULATORY_CARE_PROVIDER_SITE_OTHER): Payer: Self-pay | Admitting: Family Medicine

## 2023-09-13 DIAGNOSIS — M7712 Lateral epicondylitis, left elbow: Secondary | ICD-10-CM

## 2023-09-14 ENCOUNTER — Encounter: Payer: Self-pay | Admitting: Family Medicine

## 2023-09-14 NOTE — Progress Notes (Signed)
 FOLLOW-UP VISIT:  Shockwave Therapy Procedure   NAME:  MUMIN DENOMME DOB:  Jun 24, 1965 MR#: [id number] DATE OF VISIT:  09/14/2023  Encounter:   [name] comes in for a follow up evaluation and 2nd Radial Extracorporeal Shockwave therapy (ESWT) treatment to the  Lt elbow.  Jex reports about 25% improvement with first session.  Has continued with topical NTG - using 1/2 patch Has continued HEP Will be traveling to [place] the end of this week for son's White Coat Ceremony   ESWT Procedure Note: Treatment #2  Diagnosis: Left lateral epicondylitis with partial common extensor tendon tear   Procedure Details  Consent for the procedure was obtained. Time out was performed. The anatomical landmarks and target areas for the procedure were identified.   PROCEDURE: ESWT was discussed with the patient in detail.  The risk and benefits were explained.  The point of maximal tenderness was identified and the oscillator probe was applied with moderate pressure. Treatment adjusted as mediated by patient feedback/pain.  Left lateral epicondyle & extensor bundle was targeted for ESWT  Preset: epicondylitis Power level: 120 MJ Frequency: 16 Hz Impulses: 2500 Head size: large   Complications:  None; patient tolerated the procedure well.  Assessment/plan: Left lateral elbow pain with partial tear common extensor tendons as seen on ultrasound 07/20/2023, responding well to shockwave therapy  Plan:   ESWT completed today as noted above Return in 1 week for ESWT procedure #3 Procedure aftercare reviewed Recommended avoiding strenuous activities and using OTC analgesia prn.

## 2023-09-14 NOTE — Patient Instructions (Signed)

## 2023-09-23 ENCOUNTER — Ambulatory Visit (INDEPENDENT_AMBULATORY_CARE_PROVIDER_SITE_OTHER): Payer: Self-pay | Admitting: Family Medicine

## 2023-09-23 DIAGNOSIS — M7712 Lateral epicondylitis, left elbow: Secondary | ICD-10-CM

## 2023-09-23 NOTE — Progress Notes (Signed)
 FOLLOW-UP VISIT:  Shockwave Therapy Procedure   NAME:  Steven Hoffman DOB:  22-May-1965 MR#: [id number] DATE OF VISIT:  09/23/2023  Encounter:   [name] comes in for a follow up evaluation and 3 Radial Extracorporeal Shockwave therapy (ESWT) treatment to the left elbow.  [name] reports 80% improvement after second session.  He has continued with topical NTG - using 1/2 patch Has continued HEP  ESWT Procedure Note: Treatment #3  Diagnosis: Left lateral epicondylitis with partial common extensor tendon tear  Procedure Details  Consent for the procedure was obtained. Time out was performed. The anatomical landmarks and target areas for the procedure were identified.   PROCEDURE: ESWT was discussed with the patient in detail.  The risk and benefits were explained.  The point of maximal tenderness was identified and the oscillator probe was applied with moderate pressure. Treatment adjusted as mediated by patient feedback/pain.  Left lateral epicondyle and extensor bundle was targeted for ESWT  Preset: Epicondylitis Power level: 120 MJ Frequency: 16 Hz Impulses: 3000 Head size: Large   Complications:  None; patient tolerated the procedure well.  Plan:   ESWT completed today as noted above Return in 4 weeks to re-evaluate, could consider further ESWT procedure at that time pending response and symptoms Procedure aftercare reviewed Recommended avoiding strenuous activities and using OTC analgesia prn. Continue topical NTG  Connor Dakotah Heiman, MS4 [facility]

## 2023-09-25 NOTE — Patient Instructions (Signed)

## 2023-10-25 ENCOUNTER — Ambulatory Visit: Admitting: Family Medicine

## 2023-10-25 DIAGNOSIS — Z23 Encounter for immunization: Secondary | ICD-10-CM | POA: Diagnosis not present

## 2023-10-25 DIAGNOSIS — E109 Type 1 diabetes mellitus without complications: Secondary | ICD-10-CM | POA: Diagnosis not present

## 2023-10-25 DIAGNOSIS — I10 Essential (primary) hypertension: Secondary | ICD-10-CM | POA: Diagnosis not present

## 2023-11-24 NOTE — Progress Notes (Signed)
 PATIENT: Steven Hoffman DOB: 11-Jan-1966  REASON FOR VISIT: follow up HISTORY FROM: patient  Chief Complaint  Patient presents with   Follow-up    Pt in room 1. Alone. Here for cpap follow up.     HISTORY OF PRESENT ILLNESS:  12/06/23 ALL:  Steven Hoffman returns for follow up for OSA on CPAP. He continues to do well on therapy. He uses therapy every night. He uses travel machine often. No concerns with machine or supplies. He does note a leak. He prefers mask to be loose. AHI remains well managed. He is able to get to sleep without difficulty but wakes early morning and has a difficulty time returning to sleep. He uses melatonin XR. He is followed by PCP regularly.     12/01/2022 ALL:  Steven Hoffman returns for follow up for OSA on CPAP. He was last seen by Dr Chalice 10/2021 and doing well. He continues to use therapy nightly for about 7.5 hours, on average. He denies concerns with machine or supplies. He does rest well on therapy. When not using home machine, he is using a travel machine. He has called inquiring about Zoll Remede phrenic nerve stimulator and Inspire. He was curious about dental appliance.     10/06/2020 ALL: Steven Hoffman is a 58 y.o. male here today for follow up for OSA on CPAP.  HST 05/2020 showed moderate/severe OSA with AHI 22/hr and REM AHI 42.5/hr. AutoPAP was ordered. He feels that he is doing very well. He was not able to tolerate first CPAP machine but recently received a ResMed machine and is doing great. He has not noticed any significant difference in how he sleeps but reports he slept well previously. His blood pressure has been normal. He has not returned to PCP yet for updated blood work. He is using a nasal mask and tolerating well.     HISTORY: (copied from Dr Dohmeier's previous note)  Steven Hoffman is a 58 - year -old  Caucasian male patient who is seen here upon a referral on 04/08/2020 from Dr. Janey, MD, PhD, concerned about elevated Hgb levels. The  patient reports that he has never been as smoker, he does not have a history of asthma or exercise-induced respiratory problems.  He does not travel to high altitudes and has not done so recently.  The elevated hemoglobin has thus far been unexplained but a hematological reason with an genetic means has not been found.  One question is if the patient may have silent hypoxemia at night which can be related to an undiagnosed sleep apnea.  Steven Hoffman is aware of his snoring but he has not been told that he frequently quits breathing during sleep.  He does not report morning headaches.  Chief concern according to patient :  no answer found yet   Steven Hoffman  has a past medical history of Diabetes mellitus type 1 at age 36 and his son was diagnosed at age 106, without complication (HCC), HLD (hyperlipidemia), and Hypertension. Not on insulin pump.     Sleep relevant medical history: Nocturia, 1-2 , dreams frequently on melatonin,. Family medical /sleep history: NO other family member on CPAP with OSA, insomnia, sleep walkers.    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  Pets are  present, one dog. Tobacco use- never .  ETOH use - wine-regular with dinner. Caffeine intake in form of Coffee( 2 cups in AM  ) Soda( /) Tea (/) or energy drinks. Regular exercise in form of walking, yoga, peleton, .     Sleep habits are as follows: The patient's dinner time is between 6.30 PM. The patient goes to bed at 9.30 PM and continues to sleep for 7 hours, wakes for 1-2 bathroom breaks, the first time at 3 AM.   The preferred sleep position is  Supine , with the support of 1 pillow.  Dreams are reportedly frequent/vivid- since melatonin.  6-7  AM is the usual rise time. The patient wakes up spontaneously. He reports feeling refreshed  or restored in AM, with symptoms such as naps are not taken.    REVIEW OF SYSTEMS: Out of a complete 14 system review of symptoms, the patient complains only of the following symptoms, none and all other reviewed systems are negative.  ESS: 2/24  ALLERGIES: No Known Allergies   HOME MEDICATIONS: Outpatient Medications Prior to Visit  Medication Sig Dispense Refill   Continuous Glucose Sensor (DEXCOM G6 SENSOR) MISC Change every 10 days to monitor blood glucose continuously. 3 each 6   insulin aspart (NOVOLOG) 100 UNIT/ML injection Inject 12-20 Units into the skin 3 (three) times daily before meals. (Patient taking differently: Inject 30 Units into the skin 3 (three) times daily before meals.)     meloxicam (MOBIC) 15 MG tablet Take 15 mg by mouth as needed for pain.     methocarbamol (ROBAXIN) 500 MG tablet Take 500 mg by mouth every 6 (six) hours as needed for muscle spasms.     nitroGLYCERIN  (NITRODUR - DOSED IN MG/24 HR) 0.2 mg/hr patch Use 1/4 patch daily to the affected area. 30 patch 1   rosuvastatin (CRESTOR) 10 MG tablet Take 10 mg by mouth daily.     telmisartan (MICARDIS) 40 MG tablet Take 40 mg by mouth daily.     tirzepatide (ZEPBOUND) 5 MG/0.5ML Pen Inject 5 mg into the skin once a week.     No facility-administered medications prior to visit.    PAST MEDICAL HISTORY: Past Medical History:  Diagnosis Date   Diabetes mellitus without complication (HCC)    HLD (hyperlipidemia)    Hypertension     PAST SURGICAL HISTORY: Past Surgical History:  Procedure Laterality Date   CATARACT EXTRACTION Left    Right Eye as well   PARTIAL NEPHRECTOMY Right 1980    FAMILY HISTORY: Family History  Problem Relation Age of Onset   Hypothyroidism Mother    Nephrolithiasis Father    Prostate cancer Father    Heart disease Father     SOCIAL HISTORY: Social History   Socioeconomic History   Marital status: Married    Spouse name: Not on file   Number of children: Not on  file   Years of education: Not on file   Highest education level: Not on file  Occupational History   Not on file  Tobacco Use   Smoking status: Never   Smokeless tobacco: Never  Vaping Use   Vaping status: Never Used  Substance and Sexual Activity   Alcohol  use: Yes    Alcohol /week: 7.0 - 14.0 standard drinks of alcohol     Types: 7 - 14 Standard drinks or equivalent per week    Comment: social   Drug use: Never    Comment: gummys/ THC   Sexual activity: Not on  file  Other Topics Concern   Not on file  Social History Narrative   Not on file   Social Drivers of Health   Financial Resource Strain: Not on file  Food Insecurity: Not on file  Transportation Needs: Not on file  Physical Activity: Not on file  Stress: Not on file  Social Connections: Unknown (06/23/2021)   Received from [facility]   Social Network    Social Network: Not on file  Intimate Partner Violence: Unknown (05/15/2021)   Received from [facility]   HITS    Physically Hurt: Not on file    Insult or Talk Down To: Not on file    Threaten Physical Harm: Not on file    Scream or Curse: Not on file     PHYSICAL EXAM  Vitals:   12/06/23 1002  BP: 120/84  Pulse: 86  SpO2: 98%  Weight: 186 lb 9.6 oz (84.6 kg)  Height: 5' 10 (1.778 m)     Body mass index is 26.77 kg/m.  Generalized: Well developed, in no acute distress  Cardiology: normal rate and rhythm, no murmur noted Respiratory: clear to auscultation bilaterally  Neurological examination  Mentation: Alert oriented to time, place, history taking. Follows all commands speech and language fluent Cranial nerve II-XII: Pupils were equal round reactive to light. Extraocular movements were full, visual field were full  Motor: The motor testing reveals 5 over 5 strength of all 4 extremities. Good symmetric motor tone is noted throughout.  Gait and station: Gait is normal.    DIAGNOSTIC DATA (LABS, IMAGING, TESTING) - I reviewed patient  records, labs, notes, testing and imaging myself where available.      No data to display           Lab Results  Component Value Date   WBC 4.3 10/26/2021   HGB 16.4 10/26/2021   HCT 46.5 10/26/2021   MCV 92 10/26/2021   PLT 203 10/26/2021      Component Value Date/Time   NA 138 10/26/2021 0905   K 4.7 10/26/2021 0905   CL 99 10/26/2021 0905   CO2 26 10/26/2021 0905   GLUCOSE 144 (H) 10/26/2021 0905   GLUCOSE 123 (H) 01/10/2020 1149   BUN 13 10/26/2021 0905   CREATININE 1.10 10/26/2021 0905   CREATININE 1.11 01/10/2020 1149   CALCIUM 9.6 10/26/2021 0905   PROT 6.7 10/26/2021 0905   ALBUMIN 4.3 10/26/2021 0905   AST 39 10/26/2021 0905   AST 26 01/10/2020 1149   ALT 32 10/26/2021 0905   ALT 35 01/10/2020 1149   ALKPHOS 89 10/26/2021 0905   BILITOT 1.5 (H) 10/26/2021 0905   BILITOT 1.1 01/10/2020 1149   GFRNONAA >60 01/10/2020 1149   No results found for: CHOL, HDL, LDLCALC, LDLDIRECT, TRIG, CHOLHDL No results found for: YHAJ8R No results found for: VITAMINB12 No results found for: TSH   ASSESSMENT AND PLAN 58 y.o. year old male  has a past medical history of Diabetes mellitus without complication (HCC), HLD (hyperlipidemia), and Hypertension. here with     ICD-10-CM   1. OSA on CPAP  G47.33 For home use only DME continuous positive airway pressure (CPAP)      Steven Hoffman is doing well on CPAP therapy. Compliance report reveals excellent compliance. He was encouraged to continue using CPAP nightly and for greater than 4 hours each night. Risks of untreated sleep apnea review and education materials provided. We have reviewed different treatment options for sleep maintenance. CBT, sleep  aids and supplements discussed. He will continue close follow up with PCP. Healthy lifestyle habits encouraged. He will follow up in 1 year, sooner if needed. He verbalizes understanding and agreement with this plan.    Orders Placed This Encounter   Procedures   For home use only DME continuous positive airway pressure (CPAP)    Heated Humidity with all supplies as needed    Length of Need:   Lifetime    Patient has OSA or probable OSA:   Yes    Is the patient currently using CPAP in the home:   Yes    Settings:   Other see comments    CPAP supplies needed:   Mask, headgear, cushions, filters, heated tubing and water chamber      No orders of the defined types were placed in this encounter.    I personally spent a total of 30 minutes in the care of the patient today including preparing to see the patient, getting/reviewing separately obtained history, performing a medically appropriate exam/evaluation, counseling and educating, placing orders, documenting clinical information in the EHR, independently interpreting results, and communicating results.   Greig Forbes, FNP-C 12/06/2023, 11:00 AM [facility] [address] [place], [place] [address] [phone]

## 2023-11-24 NOTE — Patient Instructions (Addendum)
 Please continue using your CPAP regularly. While your insurance requires that you use CPAP at least 4 hours each night on 70% of the nights, I recommend, that you not skip any nights and use it throughout the night if you can. Getting used to CPAP and staying with the treatment long term does take time and patience and discipline. Untreated obstructive sleep apnea when it is moderate to severe can have an adverse impact on cardiovascular health and raise her risk for heart disease, arrhythmias, hypertension, congestive heart failure, stroke and diabetes. Untreated obstructive sleep apnea causes sleep disruption, nonrestorative sleep, and sleep deprivation. This can have an impact on your day to day functioning and cause daytime sleepiness and impairment of cognitive function, memory loss, mood disturbance, and problems focussing. Using CPAP regularly can improve these symptoms.  We will update supply orders, today.    I have copied some information for you to review from a source I use regularly called Open Evidence. Cognitive behavioral therapy (counseling) is considered the gold standard in assisting patients with sleep maintenance difficulty. Supplements can also be helpful but research is limited. I have had success using a combination of supplements, specifically melatonin extended release with L-theanine. I use a product called Nello Calm that is a combination of Vitamin D, magnesium, ashwagandha and L-theanine. Read below for more information.   Melatonin, magnesium, and zinc are the supplements with the strongest evidence for improving sleep maintenance and overall sleep quality, particularly in older adults and those with insomnia. A double-blind, placebo-controlled trial found that nightly supplementation with melatonin (5 mg), magnesium (225 mg), and zinc (11.25 mg) significantly improved sleep quality, total sleep time, and restorative value of sleep compared to placebo. Systematic reviews suggest  magnesium may reduce sleep onset latency, though its effect on total sleep time is less robust and the quality of evidence is low. Melatonin is widely supported for sleep maintenance, but results can be variable across populations.  L-theanine, glycine, tryptophan, and certain herbal remedies (valerian, passionflower, lemon balm, chamomile) may also help improve sleep duration and efficiency. These supplements act via modulation of neurotransmitter systems, especially GABAergic pathways, and have shown benefits in both subjective and objective sleep measures in randomized trials and mechanistic studies. For example, a blend containing tryptophan, glycine, magnesium, tart cherry powder, and L-theanine improved sleep efficiency and total sleep time in healthy adults. L-theanine and alpha-s1-casein hydrolysate have also demonstrated improvements in sleep duration and habitual sleep efficiency.  Tart cherry juice, kiwifruit, and foods rich in tryptophan or phytonutrients may support sleep maintenance by influencing melatonin and serotonin activity. These foods have been linked to improved sleep outcomes in narrative reviews and small clinical studies, likely due to their impact on circadian regulation.  Evidence for vitamin D, vitamin B complexes, and probiotics in improving sleep quality is emerging but remains limited and heterogeneous. Meta-analyses show some benefit for vitamin D and amino acids, but results are inconsistent and further research is needed  Overall, while several supplements show promise for sleep maintenance, melatonin, magnesium, and zinc have the most consistent evidence, and herbal or amino acid-based supplements may be considered as adjuncts. Large, high-quality trials are still needed to clarify optimal dosing and long-term safety.  Let me know if you need me!  Follow up in 1 year

## 2023-12-05 NOTE — Progress Notes (Unsigned)
 SABRA

## 2023-12-06 ENCOUNTER — Ambulatory Visit: Payer: BC Managed Care – PPO | Admitting: Family Medicine

## 2023-12-06 ENCOUNTER — Encounter: Payer: Self-pay | Admitting: Family Medicine

## 2023-12-06 VITALS — BP 120/84 | HR 86 | Ht 70.0 in | Wt 186.6 lb

## 2023-12-06 DIAGNOSIS — G4733 Obstructive sleep apnea (adult) (pediatric): Secondary | ICD-10-CM | POA: Diagnosis not present

## 2023-12-06 NOTE — Progress Notes (Signed)
 Community message sent to [employer] that order placed for CPAP supplies.

## 2023-12-11 ENCOUNTER — Encounter: Payer: Self-pay | Admitting: Family Medicine

## 2023-12-13 DIAGNOSIS — H524 Presbyopia: Secondary | ICD-10-CM | POA: Diagnosis not present

## 2023-12-13 DIAGNOSIS — E119 Type 2 diabetes mellitus without complications: Secondary | ICD-10-CM | POA: Diagnosis not present

## 2023-12-13 DIAGNOSIS — H401221 Low-tension glaucoma, left eye, mild stage: Secondary | ICD-10-CM | POA: Diagnosis not present

## 2023-12-13 DIAGNOSIS — I10 Essential (primary) hypertension: Secondary | ICD-10-CM | POA: Diagnosis not present

## 2023-12-13 DIAGNOSIS — Z961 Presence of intraocular lens: Secondary | ICD-10-CM | POA: Diagnosis not present

## 2023-12-13 DIAGNOSIS — K219 Gastro-esophageal reflux disease without esophagitis: Secondary | ICD-10-CM | POA: Diagnosis not present

## 2023-12-13 DIAGNOSIS — E109 Type 1 diabetes mellitus without complications: Secondary | ICD-10-CM | POA: Diagnosis not present

## 2023-12-13 DIAGNOSIS — K5730 Diverticulosis of large intestine without perforation or abscess without bleeding: Secondary | ICD-10-CM | POA: Diagnosis not present

## 2023-12-13 DIAGNOSIS — Z860100 Personal history of colon polyps, unspecified: Secondary | ICD-10-CM | POA: Diagnosis not present

## 2023-12-13 DIAGNOSIS — Z1211 Encounter for screening for malignant neoplasm of colon: Secondary | ICD-10-CM | POA: Diagnosis not present

## 2023-12-13 DIAGNOSIS — H33301 Unspecified retinal break, right eye: Secondary | ICD-10-CM | POA: Diagnosis not present

## 2023-12-16 DIAGNOSIS — I10 Essential (primary) hypertension: Secondary | ICD-10-CM | POA: Diagnosis not present

## 2024-01-11 DIAGNOSIS — K635 Polyp of colon: Secondary | ICD-10-CM | POA: Diagnosis not present

## 2024-01-11 DIAGNOSIS — Z1211 Encounter for screening for malignant neoplasm of colon: Secondary | ICD-10-CM | POA: Diagnosis not present

## 2024-01-11 DIAGNOSIS — K5730 Diverticulosis of large intestine without perforation or abscess without bleeding: Secondary | ICD-10-CM | POA: Diagnosis not present

## 2024-01-11 DIAGNOSIS — D122 Benign neoplasm of ascending colon: Secondary | ICD-10-CM | POA: Diagnosis not present

## 2024-01-11 DIAGNOSIS — Z860100 Personal history of colon polyps, unspecified: Secondary | ICD-10-CM | POA: Diagnosis not present

## 2024-03-15 ENCOUNTER — Other Ambulatory Visit: Payer: Self-pay | Admitting: Medical Genetics

## 2024-04-11 ENCOUNTER — Other Ambulatory Visit (HOSPITAL_COMMUNITY)

## 2025-01-01 ENCOUNTER — Ambulatory Visit: Admitting: Family Medicine
